# Patient Record
Sex: Male | Born: 1975 | Race: Black or African American | Hispanic: No | Marital: Married | Smoking: Never smoker
Health system: Southern US, Community
[De-identification: ages and names within clinical notes are randomized; demographics above are authoritative.]

## PROBLEM LIST (undated history)

## (undated) DIAGNOSIS — Z0000 Encounter for general adult medical examination without abnormal findings: Secondary | ICD-10-CM

## (undated) DIAGNOSIS — B20 Human immunodeficiency virus [HIV] disease: Secondary | ICD-10-CM

## (undated) DIAGNOSIS — Z21 Asymptomatic human immunodeficiency virus [HIV] infection status: Secondary | ICD-10-CM

## (undated) DIAGNOSIS — Z8619 Personal history of other infectious and parasitic diseases: Secondary | ICD-10-CM

## (undated) DIAGNOSIS — K4090 Unilateral inguinal hernia, without obstruction or gangrene, not specified as recurrent: Secondary | ICD-10-CM

## (undated) HISTORY — DX: Personal history of other infectious and parasitic diseases: Z86.19

## (undated) HISTORY — DX: Unilateral inguinal hernia, without obstruction or gangrene, not specified as recurrent: K40.90

## (undated) HISTORY — PX: NO PAST SURGERIES: SHX2092

## (undated) HISTORY — DX: Asymptomatic human immunodeficiency virus (hiv) infection status: Z21

## (undated) HISTORY — DX: Encounter for general adult medical examination without abnormal findings: Z00.00

## (undated) HISTORY — DX: Human immunodeficiency virus (HIV) disease: B20

---

## 2015-02-26 ENCOUNTER — Other Ambulatory Visit: Payer: Self-pay | Admitting: Infectious Disease

## 2015-02-26 ENCOUNTER — Ambulatory Visit
Admission: RE | Admit: 2015-02-26 | Discharge: 2015-02-26 | Disposition: A | Discharge status: Home / Self Care | Payer: No Typology Code available for payment source | Source: Ambulatory Visit | Attending: Infectious Disease | Admitting: Infectious Disease

## 2015-02-26 DIAGNOSIS — Z139 Encounter for screening, unspecified: Secondary | ICD-10-CM

## 2015-03-04 DIAGNOSIS — B20 Human immunodeficiency virus [HIV] disease: Secondary | ICD-10-CM | POA: Insufficient documentation

## 2015-03-29 DIAGNOSIS — A530 Latent syphilis, unspecified as early or late: Secondary | ICD-10-CM | POA: Insufficient documentation

## 2016-07-05 IMAGING — CR DG CHEST 1V
1 series · 1 of 1 positions shown · non-contrast
Comparison: None.

CLINICAL DATA: Tuberculosis screening.

EXAM:
CHEST 1 VIEW

[w chest pa]
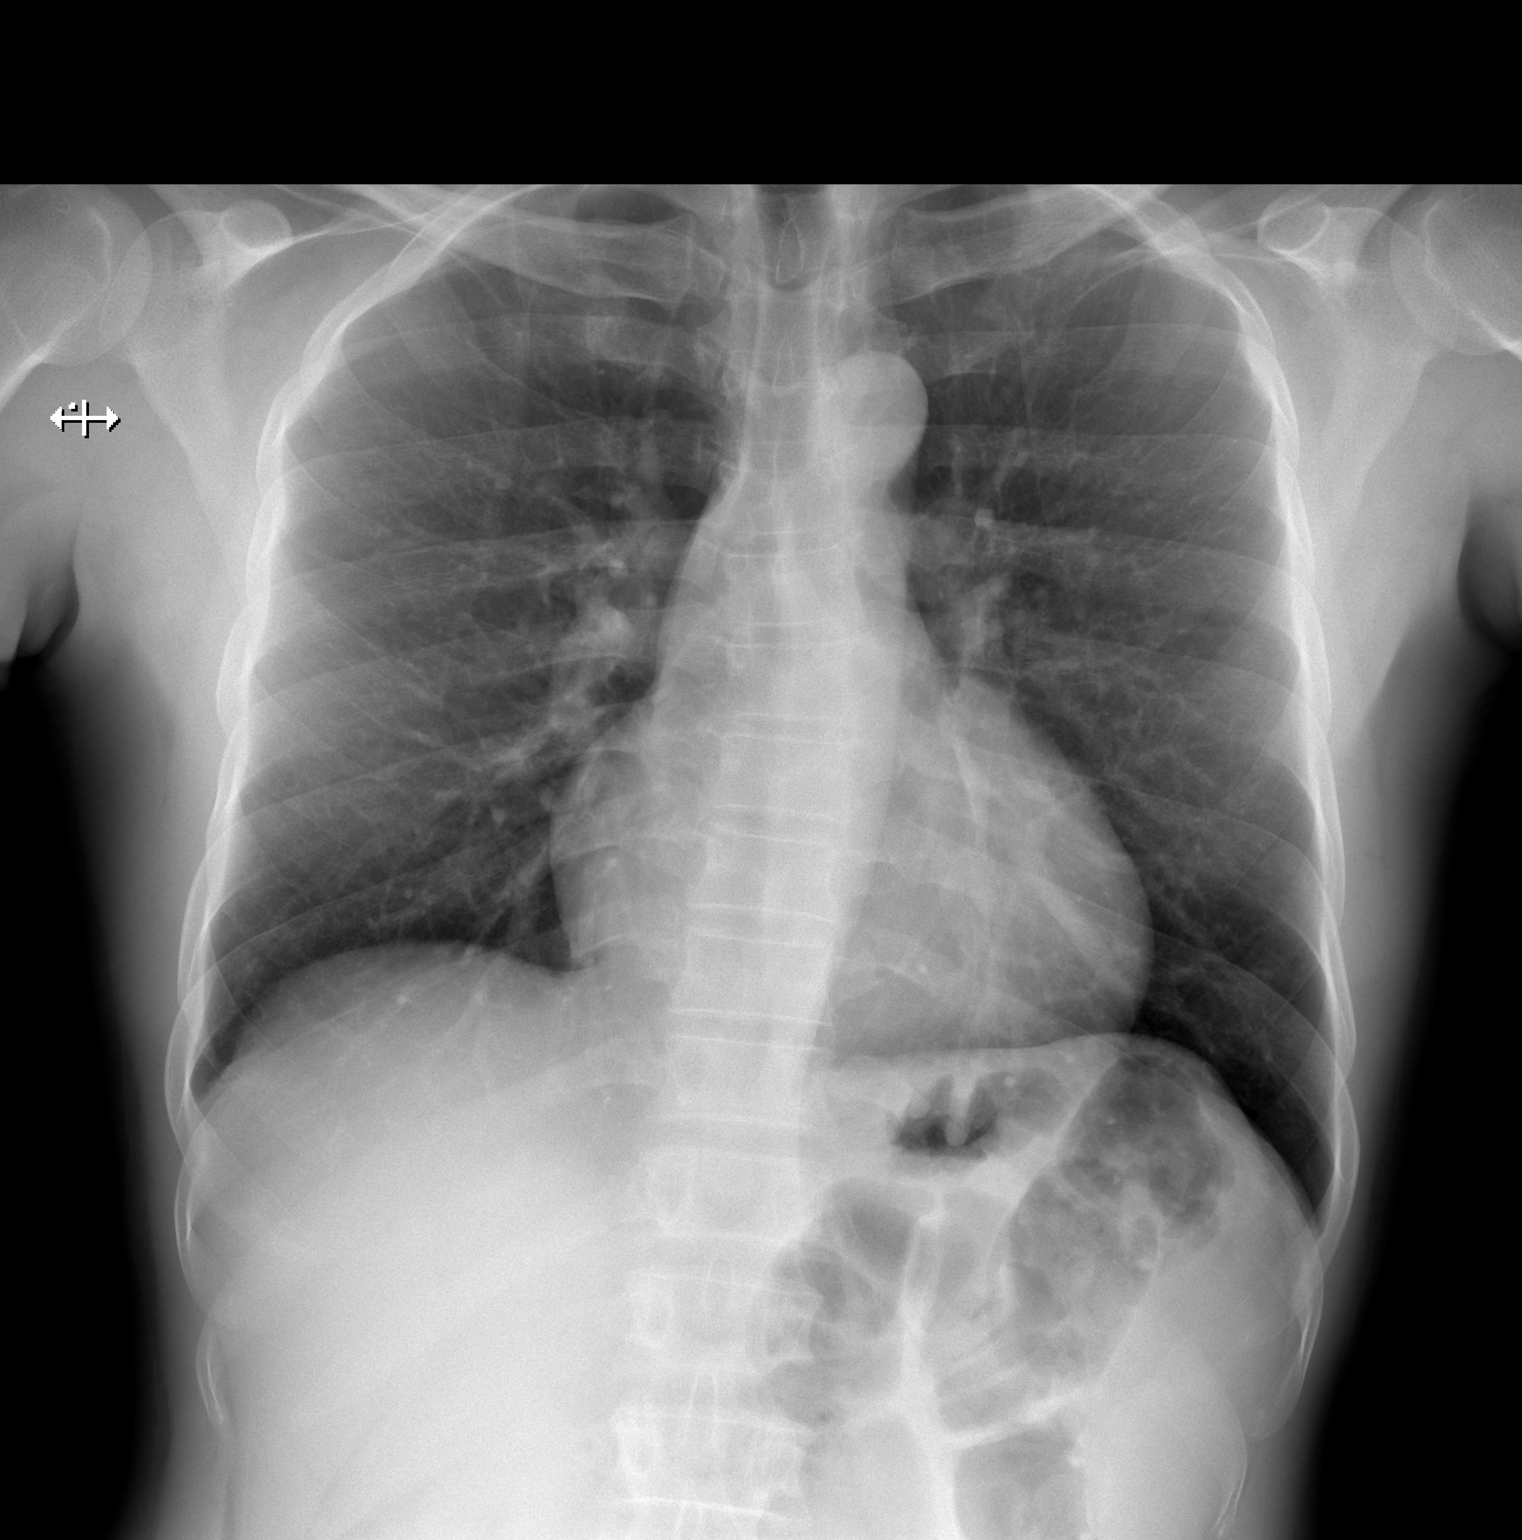

[1 of 1 positions shown; findings below may reference images not displayed]

FINDINGS: The heart size and mediastinal contours are within normal limits.
Both lungs are clear. No pneumothorax or pleural effusion is noted.
The visualized skeletal structures are unremarkable.
IMPRESSION: No acute cardiopulmonary abnormality seen.

## 2018-05-08 ENCOUNTER — Other Ambulatory Visit (HOSPITAL_COMMUNITY)
Admission: RE | Admit: 2018-05-08 | Discharge: 2018-05-08 | Disposition: A | Discharge status: Home / Self Care | Payer: Medicaid Other | Source: Ambulatory Visit | Attending: Infectious Diseases | Admitting: Infectious Diseases

## 2018-05-08 ENCOUNTER — Other Ambulatory Visit: Payer: Medicaid Other

## 2018-05-08 ENCOUNTER — Ambulatory Visit: Payer: Medicaid Other

## 2018-05-08 ENCOUNTER — Other Ambulatory Visit: Payer: Self-pay

## 2018-05-08 ENCOUNTER — Other Ambulatory Visit: Payer: Self-pay | Admitting: Behavioral Health

## 2018-05-08 DIAGNOSIS — Z79899 Other long term (current) drug therapy: Secondary | ICD-10-CM

## 2018-05-08 DIAGNOSIS — Z113 Encounter for screening for infections with a predominantly sexual mode of transmission: Secondary | ICD-10-CM | POA: Insufficient documentation

## 2018-05-08 DIAGNOSIS — B20 Human immunodeficiency virus [HIV] disease: Secondary | ICD-10-CM

## 2018-05-08 LAB — URINALYSIS
Bilirubin Urine: NEGATIVE
Glucose, UA: NEGATIVE
Hgb urine dipstick: NEGATIVE
Ketones, ur: NEGATIVE
Leukocytes,Ua: NEGATIVE
Nitrite: NEGATIVE
Protein, ur: NEGATIVE
Specific Gravity, Urine: 1.025 (ref 1.001–1.03)
pH: 5.5 (ref 5.0–8.0)

## 2018-05-09 LAB — T-HELPER CELL (CD4) - (RCID CLINIC ONLY)
CD4 % Helper T Cell: 32 % — ABNORMAL LOW (ref 33–55)
CD4 T Cell Abs: 350 /uL — ABNORMAL LOW (ref 400–2700)

## 2018-05-09 LAB — URINE CYTOLOGY ANCILLARY ONLY
Chlamydia: NEGATIVE
Neisseria Gonorrhea: NEGATIVE

## 2018-05-17 ENCOUNTER — Encounter: Payer: Self-pay | Admitting: Infectious Diseases

## 2018-05-17 LAB — HEPATITIS B SURFACE ANTIBODY,QUALITATIVE: Hep B S Ab: NONREACTIVE

## 2018-05-17 LAB — CBC WITH DIFFERENTIAL/PLATELET
Absolute Monocytes: 310 cells/uL (ref 200–950)
Basophils Absolute: 30 cells/uL (ref 0–200)
Basophils Relative: 0.9 %
Eosinophils Absolute: 340 cells/uL (ref 15–500)
Eosinophils Relative: 10.3 %
HCT: 44.9 % (ref 38.5–50.0)
Hemoglobin: 15.6 g/dL (ref 13.2–17.1)
Lymphs Abs: 1082 cells/uL (ref 850–3900)
MCH: 30.5 pg (ref 27.0–33.0)
MCHC: 34.7 g/dL (ref 32.0–36.0)
MCV: 87.9 fL (ref 80.0–100.0)
MPV: 10.6 fL (ref 7.5–12.5)
Monocytes Relative: 9.4 %
Neutro Abs: 1538 cells/uL (ref 1500–7800)
Neutrophils Relative %: 46.6 %
Platelets: 204 10*3/uL (ref 140–400)
RBC: 5.11 10*6/uL (ref 4.20–5.80)
RDW: 13 % (ref 11.0–15.0)
Total Lymphocyte: 32.8 %
WBC: 3.3 10*3/uL — ABNORMAL LOW (ref 3.8–10.8)

## 2018-05-17 LAB — QUANTIFERON-TB GOLD PLUS
Mitogen-NIL: 7.08 IU/mL
NIL: 0.04 IU/mL
QuantiFERON-TB Gold Plus: NEGATIVE
TB1-NIL: 0.01 IU/mL
TB2-NIL: 0 IU/mL

## 2018-05-17 LAB — HIV-1/2 AB - DIFFERENTIATION
HIV-1 antibody: POSITIVE — AB
HIV-2 Ab: NEGATIVE

## 2018-05-17 LAB — FLUORESCENT TREPONEMAL AB(FTA)-IGG-BLD: Fluorescent Treponemal ABS: REACTIVE — AB

## 2018-05-17 LAB — LIPID PANEL
Cholesterol: 173 mg/dL (ref <200)
HDL: 38 mg/dL — ABNORMAL LOW (ref >=40)
LDL Cholesterol (Calc): 104 mg/dL (calc) — ABNORMAL HIGH
Non-HDL Cholesterol (Calc): 135 mg/dL (calc) — ABNORMAL HIGH (ref <130)
Total CHOL/HDL Ratio: 4.6 (calc) (ref <5.0)
Triglycerides: 185 mg/dL — ABNORMAL HIGH (ref <150)

## 2018-05-17 LAB — COMPLETE METABOLIC PANEL WITH GFR
AG Ratio: 1.3 (calc) (ref 1.0–2.5)
ALT: 16 U/L (ref 9–46)
AST: 19 U/L (ref 10–40)
Albumin: 4.2 g/dL (ref 3.6–5.1)
Alkaline phosphatase (APISO): 45 U/L (ref 36–130)
BUN: 10 mg/dL (ref 7–25)
CO2: 26 mmol/L (ref 20–32)
Calcium: 8.8 mg/dL (ref 8.6–10.3)
Chloride: 105 mmol/L (ref 98–110)
Creat: 0.82 mg/dL (ref 0.60–1.35)
GFR, Est African American: 126 mL/min/{1.73_m2} (ref >=60)
GFR, Est Non African American: 109 mL/min/{1.73_m2} (ref >=60)
Globulin: 3.2 g/dL (calc) (ref 1.9–3.7)
Glucose, Bld: 110 mg/dL — ABNORMAL HIGH (ref 65–99)
Potassium: 4 mmol/L (ref 3.5–5.3)
Sodium: 138 mmol/L (ref 135–146)
Total Bilirubin: 0.3 mg/dL (ref 0.2–1.2)
Total Protein: 7.4 g/dL (ref 6.1–8.1)

## 2018-05-17 LAB — HEPATITIS B SURFACE ANTIGEN: Hepatitis B Surface Ag: NONREACTIVE

## 2018-05-17 LAB — HIV-1 RNA ULTRAQUANT REFLEX TO GENTYP+
HIV 1 RNA Quant: <20 copies/mL
HIV-1 RNA Quant, Log: <1.3 Log copies/mL

## 2018-05-17 LAB — HLA B*5701: HLA-B*5701 w/rflx HLA-B High: POSITIVE — AB

## 2018-05-17 LAB — RPR TITER: RPR Titer: 1:1 {titer} — ABNORMAL HIGH

## 2018-05-17 LAB — HEPATITIS C ANTIBODY
Hepatitis C Ab: NONREACTIVE
SIGNAL TO CUT-OFF: 0.06 (ref <1.00)

## 2018-05-17 LAB — HIV ANTIBODY (ROUTINE TESTING W REFLEX): HIV 1&2 Ab, 4th Generation: REACTIVE — AB

## 2018-05-17 LAB — HEPATITIS B CORE ANTIBODY, TOTAL: Hep B Core Total Ab: REACTIVE — AB

## 2018-05-17 LAB — RPR: RPR Ser Ql: REACTIVE — AB

## 2018-05-17 LAB — HEPATITIS A ANTIBODY, TOTAL: Hepatitis A AB,Total: REACTIVE — AB

## 2018-06-11 ENCOUNTER — Telehealth: Payer: Self-pay | Admitting: Pharmacy Technician

## 2018-06-11 ENCOUNTER — Ambulatory Visit: Payer: Self-pay | Admitting: Pharmacist

## 2018-06-11 ENCOUNTER — Encounter: Payer: Self-pay | Admitting: Infectious Diseases

## 2018-06-11 NOTE — Telephone Encounter (Signed)
[facility] Patient Product/process development scientist completed.    The patient is insured through [employer] commercial plan and has a $120 copay.  We will continue to follow to see if a copay card is needed.  Netty Starring. Dimas Aguas CPhT Specialty Pharmacy Patient [facility] Phone: [phone] Fax:  [phone]

## 2018-07-25 ENCOUNTER — Ambulatory Visit: Payer: Medicaid Other | Admitting: Infectious Diseases

## 2018-07-25 ENCOUNTER — Ambulatory Visit: Payer: Medicaid Other | Admitting: Pharmacist

## 2018-07-30 ENCOUNTER — Ambulatory Visit: Payer: Medicaid Other | Admitting: Infectious Diseases

## 2018-07-30 ENCOUNTER — Ambulatory Visit: Payer: Medicaid Other | Admitting: Pharmacist

## 2018-07-31 ENCOUNTER — Telehealth: Payer: Self-pay | Admitting: Infectious Diseases

## 2018-07-31 NOTE — Telephone Encounter (Signed)
COVID-19 Pre-Screening Questions: ° °Do you currently have a fever (>100 °F), chills or unexplained body aches? No  ° °Are you currently experiencing new cough, shortness of breath, sore throat, runny nose? No  °•  °Have you recently travelled outside the state of [place] in the last 14 days? No  °•  °1. Have you been in contact with someone that is currently pending confirmation of Covid19 testing or has been confirmed to have the Covid19 virus?  No  ° °

## 2018-08-01 ENCOUNTER — Encounter: Payer: Self-pay | Admitting: Infectious Diseases

## 2018-08-01 ENCOUNTER — Ambulatory Visit (INDEPENDENT_AMBULATORY_CARE_PROVIDER_SITE_OTHER): Payer: Medicaid Other | Admitting: Infectious Diseases

## 2018-08-01 ENCOUNTER — Other Ambulatory Visit: Payer: Self-pay

## 2018-08-01 ENCOUNTER — Ambulatory Visit: Payer: Medicaid Other | Admitting: Pharmacist

## 2018-08-01 VITALS — BP 158/106 | HR 70 | Temp 98.5°F

## 2018-08-01 DIAGNOSIS — Z21 Asymptomatic human immunodeficiency virus [HIV] infection status: Secondary | ICD-10-CM

## 2018-08-01 DIAGNOSIS — Z8619 Personal history of other infectious and parasitic diseases: Secondary | ICD-10-CM

## 2018-08-01 DIAGNOSIS — K4090 Unilateral inguinal hernia, without obstruction or gangrene, not specified as recurrent: Secondary | ICD-10-CM

## 2018-08-01 DIAGNOSIS — B20 Human immunodeficiency virus [HIV] disease: Secondary | ICD-10-CM

## 2018-08-01 DIAGNOSIS — Z0000 Encounter for general adult medical examination without abnormal findings: Secondary | ICD-10-CM

## 2018-08-01 NOTE — Patient Instructions (Addendum)
Please continue to take your Genfoya (blue-green pill) with food once a day   Please bring your pay stubs back as soon as possible so we can process your paperwork.   Will work on getting your referral to general surgery to look at your hernia.   Please come back in 1 month.

## 2018-08-01 NOTE — Progress Notes (Signed)
Name: Eric Keith  DOB: 29-Dec-1975 MRN: [id number] PCP: Patient, No Pcp Per    Brief Narrative:  Eric Keith is a 43 y.o. male with HIV disease, Dx at a rescue camp in [place] 2005. He did not start on medications right away but when he arrived here in [place] in 2016 he his since been on antiretroviral therapy.  CD4 nadir unknown VL unknown HIV Risk: geographic  History of OIs: Unknown Intake Labs 05/2018 Hep B sAg (-), sAb (-), cAb (-); Hep A (+), Hep C (-) Quantiferon (-) HLA B*5701 (+) G6PD: ()   Previous Regimens:  Stribild >> suppressed  Genotypes:  2017: wildtype  Subjective:  CC: New patient for HIV care. Left inguinal pain d/t hernia.  [place] interpreter was used on the telephone to facilitate visit.   HPI: He is here today for his new patient visit.  Unfortunately he is missed a few appointments since his lab work in April and we are unable to locate his records for care.  He says he was previously in care somewhere in [facility].  He is not certain where, who the provider was, or his medication names.  He is from [place] and has been in [place] since 2016. He was diagnosed with HIV while he was at a rescue camp in [place] in 2005.  He says he was not started on antiretroviral therapy right away and it was only when he arrived in [place] that was offered medication.  He says he is on 1 blue pill once a day but does not know the name.  He has access to medications and has a bottle at home now.  He does not miss any doses of his medications.  He does not think he has any concerns over side effects.  He did not bring enough documentation to start [place] application today.    He otherwise describes himself to be healthy and on no other medications or supplements daily.  He notices that he has trouble with tolerating the cold temperatures that he is exposed to at work at the [employer].  He does wear gloves which does  help.  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  Denies any alcohol, cigarette, drug use.   He has a left groin hernia that he is requesting surgery for.  He says that at work he spends a lot of time lifting things up and twisting at the waist.  He says that his hernia will pop out and cause him a fair amount of pain.  He is only able to reduce the hernia back into his abdomen after arrest.  Of sometimes up to 2 to 3 hours.  He denies any significant severe pain or change to bowel habits.  No fevers no chills.   Review of Systems  Constitutional: Negative for chills, fever, malaise/fatigue and weight loss.  HENT: Negative for sore throat.   Respiratory: Negative for cough, sputum production and shortness of breath.   Cardiovascular: Negative.   Gastrointestinal: Positive for abdominal pain (left hernia pain as described in HPI). Negative for diarrhea and vomiting.  Musculoskeletal: Negative for joint pain, myalgias and neck pain.  Skin: Negative for rash.       Hands intolerant to cold.   Neurological: Negative for headaches.  Psychiatric/Behavioral: Negative for depression and substance abuse. The patient is  not nervous/anxious.     Past Medical History:  Diagnosis Date   Healthcare maintenance    History of syphilis    HIV infection ([place])    Inguinal hernia of left side without obstruction or gangrene     Outpatient Medications Prior to Visit  Medication Sig Dispense Refill   elvitegravir-cobicistat-emtricitabine-tenofovir (STRIBILD) 150-150-200-300 MG TABS tablet Take 1 tablet by mouth daily with breakfast.     No facility-administered medications prior to visit.      Allergies  Allergen Reactions   Abacavir     HLA B*5701 (+)    Social History   Tobacco Use   Smoking status: Never Smoker  Substance Use Topics   Alcohol use: Never    Frequency: Never   Drug use: Never    No family  history on file.  Social History   Substance and Sexual Activity  Sexual Activity Yes   Partners: Female     Objective:   Vitals:   08/01/18 0939  BP: (!) 158/106  Pulse: 70  Temp: 98.5 F (36.9 C)  TempSrc: Oral   There is no height or weight on file to calculate BMI.  Physical Exam Constitutional:      Appearance: He is well-developed.     Comments: Seated comfortably in chair during visit.   HENT:     Mouth/Throat:     Dentition: Normal dentition. No dental abscesses.  Cardiovascular:     Rate and Rhythm: Normal rate and regular rhythm.     Heart sounds: Normal heart sounds.  Pulmonary:     Effort: Pulmonary effort is normal.     Breath sounds: Normal breath sounds.  Abdominal:     General: There is no distension.     Palpations: Abdomen is soft.     Tenderness: There is no abdominal tenderness.  Genitourinary:    Comments: Unable to perform exam with limited time availability with interpretor.  Lymphadenopathy:     Cervical: No cervical adenopathy.  Skin:    General: Skin is warm and dry.     Findings: No rash.  Neurological:     Mental Status: He is alert and oriented to person, place, and time.  Psychiatric:        Judgment: Judgment normal.     Comments: In good spirits today and engaged in care discussion.      Lab Results Lab Results  Component Value Date   WBC 3.3 (L) 05/08/2018   HGB 15.6 05/08/2018   HCT 44.9 05/08/2018   MCV 87.9 05/08/2018   PLT 204 05/08/2018    Lab Results  Component Value Date   CREATININE 0.82 05/08/2018   BUN 10 05/08/2018   NA 138 05/08/2018   K 4.0 05/08/2018   CL 105 05/08/2018   CO2 26 05/08/2018    Lab Results  Component Value Date   ALT 16 05/08/2018   AST 19 05/08/2018   BILITOT 0.3 05/08/2018    Lab Results  Component Value Date   CHOL 173 05/08/2018   HDL 38 (L) 05/08/2018   LDLCALC 104 (H) 05/08/2018   TRIG 185 (H) 05/08/2018   CHOLHDL 4.6 05/08/2018   HIV 1 RNA Quant (copies/mL)    Date Value  05/08/2018 <20 NOT DETECTED   CD4 T Cell Abs (/uL)  Date Value  05/08/2018 350 (L)     Assessment & Plan:   Problem List Items Addressed This Visit      Unprioritized   Healthcare maintenance  He is received the Menveo and Pneumovax in 2018.  He has hepatitis B core antibody positive this was also such the case back in 2018.  He does not have protective surface antibody and will recommend at future visits hepatitis B vaccine series. Flu shot in the fall.  Prevnar needed       History of syphilis    Syphilis titers previously indicat serofast between 1-2 to 1-4.  No signs of recurrent or reinfection.      HIV infection [facility])    New patient here to establish for HIV care. Currently maintained on Genvoya.  He had one other antiretroviral medication in the past prior to establishing with [facility] but this is not available per record report.  His viral load in April was undetectable with adequate CD4 count above 300.  He has no signs of opportunistic infection or advanced HIV on exam today.   I discussed with Michell Heinrich treatment options/side effects, benefits of treatment and long-term outcomes. I discussed how HIV is transmitted and the process of untreated HIV including increased risk for opportunistic infections, cancer, dementia and renal failure. Patient was counseled on routine HIV care including medication adherence, blood monitoring, necessary vaccines and follow up visits. Counseled regarding safe sex practices including: condom use, partner disclosure, limiting partners.  We did not have time to have him meet with Cassie today due to limited time from interpreter.  We will have him meet her at next appointment in 1 month.  Will continue his Genvoya for HIV treatment.  I stressed to him the importance of bringing required paperwork so we can avoid a delay relapse in his care and medications to treat his HIV.  He will bring the required pay stubs and drop  them off to our clinic for [facility]. Discussed interval for re-application today and services provided on [place] HMAP.   General introduction to our clinic and integrated services.  We were unable to complete the dental application today due to time constraints.  We will plan on discussing with him at his return visit in 1 month.  I spent greater than 30 minutes with the patient today. Greater than 50% of the time spent face-to-face counseling and coordination of care re: HIV and health maintenance.        Relevant Medications   elvitegravir-cobicistat-emtricitabine-tenofovir (STRIBILD) 150-150-200-300 MG TABS tablet   Inguinal hernia of left side without obstruction or gangrene    It appears that [facility] is try to get him into surgery for evaluation and management of his hernia for a while now.  Given his uninsured status will place referral for [facility].  I am not certain that this will work for him given his restrictions and transportation that I see documented in his chart.  Precautions discussed that would warrant urgent evaluation.  He does not have any urgent symptoms and this is been established but worsening problem for nearly 2 years now.  I tried showing in discussing a hernia belt although I do not think this translated well.  He is unable to avoid any heavy lifting to the nature of his work.  Unfortunately local general surgery team will not see uninsured patients.  Will send referral to [facility] and attempt to schedule an appointment.  This is a slowly worsening a chronic problem without any acute signs that needs intervention urgently.         Other Visit Diagnoses    Left groin hernia    -  Primary  Relevant Orders   Ambulatory referral to [place], MSN, NP-C [place] [facility] [place] Pager: [phone] Office: [phone]  08/02/18  9:31 AM

## 2018-08-01 NOTE — Progress Notes (Signed)
Patient had limited time with interpreter. I will meet with him the next time he is in clinic on 7/23.

## 2018-08-02 ENCOUNTER — Encounter: Payer: Self-pay | Admitting: Infectious Diseases

## 2018-08-02 DIAGNOSIS — B20 Human immunodeficiency virus [HIV] disease: Secondary | ICD-10-CM | POA: Insufficient documentation

## 2018-08-02 DIAGNOSIS — Z8619 Personal history of other infectious and parasitic diseases: Secondary | ICD-10-CM | POA: Insufficient documentation

## 2018-08-02 DIAGNOSIS — K4090 Unilateral inguinal hernia, without obstruction or gangrene, not specified as recurrent: Secondary | ICD-10-CM | POA: Insufficient documentation

## 2018-08-02 DIAGNOSIS — Z0000 Encounter for general adult medical examination without abnormal findings: Secondary | ICD-10-CM | POA: Insufficient documentation

## 2018-08-02 MED ORDER — GENVOYA 150-150-200-10 MG PO TABS: 1.0000 | ORAL_TABLET | Freq: Every day | ORAL | 5 refills | Status: DC

## 2018-08-02 NOTE — Assessment & Plan Note (Addendum)
It appears that [facility] is try to get him into surgery for evaluation and management of his hernia for a while now.  Given his uninsured status will place referral for [facility].  I am not certain that this will work for him given his restrictions and transportation that I see documented in his chart.  Precautions discussed that would warrant urgent evaluation.  He does not have any urgent symptoms and this is been established but worsening problem for nearly 2 years now.  I tried showing in discussing a hernia belt although I do not think this translated well.  He is unable to avoid any heavy lifting to the nature of his work.  Unfortunately local general surgery team will not see uninsured patients.  Will send referral to [facility] and attempt to schedule an appointment.  This is a slowly worsening a chronic problem without any acute signs that needs intervention urgently.

## 2018-08-02 NOTE — Assessment & Plan Note (Signed)
Syphilis titers previously indicat serofast between 1-2 to 1-4.  No signs of recurrent or reinfection.

## 2018-08-02 NOTE — Assessment & Plan Note (Signed)
New patient here to establish for HIV care. Currently maintained on Genvoya.  He had one other antiretroviral medication in the past prior to establishing with [facility] but this is not available per record report.  His viral load in April was undetectable with adequate CD4 count above 300.  He has no signs of opportunistic infection or advanced HIV on exam today.   I discussed with Michell Heinrich treatment options/side effects, benefits of treatment and long-term outcomes. I discussed how HIV is transmitted and the process of untreated HIV including increased risk for opportunistic infections, cancer, dementia and renal failure. Patient was counseled on routine HIV care including medication adherence, blood monitoring, necessary vaccines and follow up visits. Counseled regarding safe sex practices including: condom use, partner disclosure, limiting partners.  We did not have time to have him meet with Cassie today due to limited time from interpreter.  We will have him meet her at next appointment in 1 month.  Will continue his Genvoya for HIV treatment.  I stressed to him the importance of bringing required paperwork so we can avoid a delay relapse in his care and medications to treat his HIV.  He will bring the required pay stubs and drop them off to our clinic for [facility]. Discussed interval for re-application today and services provided on [place] HMAP.   General introduction to our clinic and integrated services.  We were unable to complete the dental application today due to time constraints.  We will plan on discussing with him at his return visit in 1 month.  I spent greater than 30 minutes with the patient today. Greater than 50% of the time spent face-to-face counseling and coordination of care re: HIV and health maintenance.

## 2018-08-02 NOTE — Assessment & Plan Note (Signed)
He is received the Menveo and Pneumovax in 2018.  He has hepatitis B core antibody positive this was also such the case back in 2018.  He does not have protective surface antibody and will recommend at future visits hepatitis B vaccine series. Flu shot in the fall.  Prevnar needed

## 2018-08-08 ENCOUNTER — Encounter: Payer: Self-pay | Admitting: Infectious Diseases

## 2018-08-29 ENCOUNTER — Other Ambulatory Visit: Payer: Self-pay

## 2018-08-29 ENCOUNTER — Ambulatory Visit (INDEPENDENT_AMBULATORY_CARE_PROVIDER_SITE_OTHER): Payer: Self-pay | Admitting: Infectious Diseases

## 2018-08-29 VITALS — BP 137/93 | HR 77 | Temp 98.1°F | Wt 207.0 lb

## 2018-08-29 DIAGNOSIS — B20 Human immunodeficiency virus [HIV] disease: Secondary | ICD-10-CM

## 2018-08-29 NOTE — Progress Notes (Signed)
Interpreter was not available for the visit.  We waited for about 30 minutes before we rescheduled the patient for Monday at 11:15.  We booked an appointment with [facility] interpreter at this time.  No charge today.

## 2018-08-29 NOTE — Telephone Encounter (Signed)
Patient commercial insurance is still active for appointment on 07/23.

## 2018-09-02 ENCOUNTER — Ambulatory Visit (INDEPENDENT_AMBULATORY_CARE_PROVIDER_SITE_OTHER): Payer: Self-pay | Admitting: Infectious Diseases

## 2018-09-02 ENCOUNTER — Other Ambulatory Visit: Payer: Self-pay

## 2018-09-02 ENCOUNTER — Encounter: Payer: Self-pay | Admitting: Infectious Diseases

## 2018-09-02 DIAGNOSIS — K4090 Unilateral inguinal hernia, without obstruction or gangrene, not specified as recurrent: Secondary | ICD-10-CM

## 2018-09-02 DIAGNOSIS — Z21 Asymptomatic human immunodeficiency virus [HIV] infection status: Secondary | ICD-10-CM

## 2018-09-02 NOTE — Patient Instructions (Addendum)
For your hernia -  --You have an appointment with Dr. Phineas Douglas on July 30th at 9:30 am --[facility]       [address]  [address]   [place] [address]   Phone#  [phone]  Please continue taking your Genvoya (green pill) every single day  Will have you come back in 3 months to repeat your blood work and get your.   Please bring pay stub from July 9th and July 16th any time this week Monday through Thursday so we can complete your application.

## 2018-09-02 NOTE — Assessment & Plan Note (Signed)
He has been doing well on his Genvoya. Met with Juliann Pulse again today - he needs to bring a few more pay stubs in to help with this application interval. He will bring them to the clnic this week once available.  He needs Hep B vaccines. Annual flu shot in the fall. Will defer until [employer] services verified.

## 2018-09-02 NOTE — Assessment & Plan Note (Signed)
Appointment rescheduled for July 30th @ 930 am and transportation arranged for evaluation. Communicated with him via Optometrist.

## 2018-09-02 NOTE — Progress Notes (Signed)
Name: Eric Keith  DOB: March 21, 1975 MRN: [id number] PCP: Patient, No Pcp Per    Brief Narrative:  Eric Keith is a 43 y.o. male with HIV disease, Dx at a rescue camp in [place] 2005. He did not start on medications right away but when he arrived here in [place] in 2016 he his since been on antiretroviral therapy.  CD4 nadir unknown VL unknown HIV Risk: geographic  History of OIs: Unknown Intake Labs 05/2018 Hep B sAg (-), sAb (-), cAb (-); Hep A (+), Hep C (-) Quantiferon (-) HLA B*5701 (+) G6PD: ()  Previous Regimens: Jorje Guild >> suppressed  Genotypes: . 2017: wildtype  Subjective:  CC: New HIV patient - only concern is referral to General Surgery.  Local [place] interpreter was used on the telephone to facilitate visit.   HPI: He is here today for follow up visit to ensure intake complete due to previous limited time with interpreter.   We were able to update his family and medical history today. He continues to take his Genvoya once daily as prescribed with food. No trouble with side effects and at the moment has full access to his medication.   His left groin hernia remains unchanged. Has not heard from Gen Surgery team yet. He continues to work evening shifts at [employer]. He has 2 pay stubs with him today for financial team.    Review of Systems  Constitutional: Negative for chills, fever, malaise/fatigue and weight loss.  HENT: Negative for sore throat.        No dental problems  Respiratory: Negative for cough and sputum production.   Cardiovascular: Negative for chest pain and leg swelling.  Gastrointestinal: Negative for abdominal pain, diarrhea and vomiting.  Genitourinary: Negative for dysuria and flank pain.  Musculoskeletal: Negative for joint pain, myalgias and neck pain.  Skin: Negative for rash.  Neurological: Negative for dizziness, tingling and headaches.  Psychiatric/Behavioral: Negative for depression and substance abuse. The  patient is not nervous/anxious and does not have insomnia.     Past Medical History:  Diagnosis Date  . Healthcare maintenance   . History of syphilis   . HIV infection ([place])   . Inguinal hernia of left side without obstruction or gangrene     Outpatient Medications Prior to Visit  Medication Sig Dispense Refill  . elvitegravir-cobicistat-emtricitabine-tenofovir (GENVOYA) 150-150-200-10 MG TABS tablet Take 1 tablet by mouth daily with breakfast. 30 tablet 5   No facility-administered medications prior to visit.      Allergies  Allergen Reactions  . Abacavir     HLA B*5701 (+)    Social History   Tobacco Use  . Smoking status: Never Smoker  Substance Use Topics  . Alcohol use: Never    Frequency: Never  . Drug use: Never    Family History  Problem Relation Age of Onset  . Healthy Mother   . Healthy Father     Social History   Substance and Sexual Activity  Sexual Activity Yes  . Partners: Female     Objective:   Vitals:   09/02/18 1137  BP: (!) 139/100  Pulse: 74  Temp: 98 F (36.7 C)  TempSrc: Oral  SpO2: 97%   There is no height or weight on file to calculate BMI.   Lab Results Lab Results  Component Value Date   WBC 3.3 (L) 05/08/2018   HGB 15.6 05/08/2018   HCT 44.9 05/08/2018   MCV 87.9 05/08/2018   PLT 204 05/08/2018  Lab Results  Component Value Date   CREATININE 0.82 05/08/2018   BUN 10 05/08/2018   NA 138 05/08/2018   K 4.0 05/08/2018   CL 105 05/08/2018   CO2 26 05/08/2018    Lab Results  Component Value Date   ALT 16 05/08/2018   AST 19 05/08/2018   BILITOT 0.3 05/08/2018    Lab Results  Component Value Date   CHOL 173 05/08/2018   HDL 38 (L) 05/08/2018   LDLCALC 104 (H) 05/08/2018   TRIG 185 (H) 05/08/2018   CHOLHDL 4.6 05/08/2018   HIV 1 RNA Quant (copies/mL)  Date Value  05/08/2018 <20 NOT DETECTED   CD4 T Cell Abs (/uL)  Date Value  05/08/2018 350 (L)     Assessment & Plan:   Problem List Items  Addressed This Visit      Unprioritized   Inguinal hernia of left side without obstruction or gangrene    Appointment rescheduled for July 30th @ 930 am and transportation arranged for evaluation. Communicated with him via Optometrist.       HIV infection ([place])    He has been doing well on his Genvoya. Met with Juliann Pulse again today - he needs to bring a few more pay stubs in to help with this application interval. He will bring them to the clnic this week once available.  He needs Hep B vaccines. Annual flu shot in the fall. Will defer until [employer] services verified.         Return in about 3 months (around 12/03/2018) for follow up.   25 min spent during the visit in discussion of the above and in coordination of his care and referrals.   Janene Madeira, MSN, NP-C [facility] [place] Pager: [phone] Office: [phone]  09/02/18  4:12 PM

## 2018-10-22 DIAGNOSIS — Z9889 Other specified postprocedural states: Secondary | ICD-10-CM | POA: Insufficient documentation

## 2018-11-08 ENCOUNTER — Other Ambulatory Visit: Payer: Self-pay | Admitting: *Deleted

## 2018-11-08 DIAGNOSIS — B20 Human immunodeficiency virus [HIV] disease: Secondary | ICD-10-CM

## 2018-11-11 ENCOUNTER — Other Ambulatory Visit: Payer: Medicaid Other

## 2018-11-25 ENCOUNTER — Encounter: Payer: Medicaid Other | Admitting: Infectious Diseases

## 2018-12-11 ENCOUNTER — Other Ambulatory Visit: Payer: Medicaid Other

## 2018-12-11 ENCOUNTER — Other Ambulatory Visit: Payer: Self-pay

## 2018-12-11 DIAGNOSIS — B20 Human immunodeficiency virus [HIV] disease: Secondary | ICD-10-CM

## 2018-12-12 LAB — T-HELPER CELL (CD4) - (RCID CLINIC ONLY)
CD4 % Helper T Cell: 27 % — ABNORMAL LOW (ref 33–65)
CD4 T Cell Abs: 599 /uL (ref 400–1790)

## 2018-12-15 LAB — CBC WITH DIFFERENTIAL/PLATELET
Absolute Monocytes: 431 cells/uL (ref 200–950)
Basophils Absolute: 40 cells/uL (ref 0–200)
Basophils Relative: 0.9 %
Eosinophils Absolute: 480 cells/uL (ref 15–500)
Eosinophils Relative: 10.9 %
HCT: 43.4 % (ref 38.5–50.0)
Hemoglobin: 14.8 g/dL (ref 13.2–17.1)
Lymphs Abs: 2169 cells/uL (ref 850–3900)
MCH: 30.5 pg (ref 27.0–33.0)
MCHC: 34.1 g/dL (ref 32.0–36.0)
MCV: 89.3 fL (ref 80.0–100.0)
MPV: 9.7 fL (ref 7.5–12.5)
Monocytes Relative: 9.8 %
Neutro Abs: 1280 cells/uL — ABNORMAL LOW (ref 1500–7800)
Neutrophils Relative %: 29.1 %
Platelets: 216 10*3/uL (ref 140–400)
RBC: 4.86 10*6/uL (ref 4.20–5.80)
RDW: 13.4 % (ref 11.0–15.0)
Total Lymphocyte: 49.3 %
WBC: 4.4 10*3/uL (ref 3.8–10.8)

## 2018-12-15 LAB — COMPLETE METABOLIC PANEL WITH GFR
AG Ratio: 1.2 (calc) (ref 1.0–2.5)
ALT: 21 U/L (ref 9–46)
AST: 24 U/L (ref 10–40)
Albumin: 4.3 g/dL (ref 3.6–5.1)
Alkaline phosphatase (APISO): 59 U/L (ref 36–130)
BUN: 14 mg/dL (ref 7–25)
CO2: 30 mmol/L (ref 20–32)
Calcium: 9.3 mg/dL (ref 8.6–10.3)
Chloride: 103 mmol/L (ref 98–110)
Creat: 0.94 mg/dL (ref 0.60–1.35)
GFR, Est African American: 115 mL/min/{1.73_m2} (ref >=60)
GFR, Est Non African American: 100 mL/min/{1.73_m2} (ref >=60)
Globulin: 3.5 g/dL (calc) (ref 1.9–3.7)
Glucose, Bld: 91 mg/dL (ref 65–99)
Potassium: 4 mmol/L (ref 3.5–5.3)
Sodium: 141 mmol/L (ref 135–146)
Total Bilirubin: 0.4 mg/dL (ref 0.2–1.2)
Total Protein: 7.8 g/dL (ref 6.1–8.1)

## 2018-12-15 LAB — HIV-1 RNA QUANT-NO REFLEX-BLD
HIV 1 RNA Quant: <20 copies/mL
HIV-1 RNA Quant, Log: <1.3 Log copies/mL

## 2018-12-26 ENCOUNTER — Ambulatory Visit (INDEPENDENT_AMBULATORY_CARE_PROVIDER_SITE_OTHER): Payer: Self-pay | Admitting: Infectious Diseases

## 2018-12-26 ENCOUNTER — Encounter: Payer: Self-pay | Admitting: Infectious Diseases

## 2018-12-26 ENCOUNTER — Other Ambulatory Visit: Payer: Self-pay

## 2018-12-26 DIAGNOSIS — Z21 Asymptomatic human immunodeficiency virus [HIV] infection status: Secondary | ICD-10-CM

## 2018-12-26 DIAGNOSIS — K4090 Unilateral inguinal hernia, without obstruction or gangrene, not specified as recurrent: Secondary | ICD-10-CM

## 2018-12-26 DIAGNOSIS — Z23 Encounter for immunization: Secondary | ICD-10-CM

## 2018-12-26 DIAGNOSIS — Z0000 Encounter for general adult medical examination without abnormal findings: Secondary | ICD-10-CM

## 2018-12-26 MED ORDER — GENVOYA 150-150-200-10 MG PO TABS: 1.0000 | ORAL_TABLET | Freq: Every day | ORAL | 5 refills | Status: DC

## 2018-12-26 NOTE — Assessment & Plan Note (Signed)
He has had surgical repair and doing well.

## 2018-12-26 NOTE — Assessment & Plan Note (Signed)
Flu vaccine today. Otherwise up to date for recommended vaccines for [facility].

## 2018-12-26 NOTE — Patient Instructions (Signed)
Please continue your Genvoya every day as you are.   Please come back in 6 months with labs prior to your appointment.   Will try to coordinate dental visit for you.

## 2018-12-26 NOTE — Assessment & Plan Note (Signed)
Well controlled on current regimen on Genvoya. Will continue this for him and have him back for routine care in 6 months.  Will give flu shot today and try to coordinate

## 2018-12-26 NOTE — Progress Notes (Signed)
Name: Eric Keith  DOB: 1975/06/19 MRN: [id number] PCP: Patient, No Pcp Per    Brief Narrative:  Eric Keith is a 43 y.o. male with HIV disease, Dx at a rescue camp in [place] 2005. He did not start on medications right away but when he arrived here in [place] in 2016 he his since been on antiretroviral therapy.  CD4 nadir unknown VL unknown HIV Risk: geographic  History of OIs: Unknown Intake Labs 05/2018 Hep B sAg (-), sAb (-), cAb (-); Hep A (+), Hep C (-) Quantiferon (-) HLA B*5701 (+) G6PD: ()  Previous Regimens: Jorje Guild >> suppressed  Genotypes: . 2017: wildtype  Subjective:  CC: New HIV patient - only concern is referral to General Surgery.  Local [place] interpreter was used on the telephone to facilitate visit.   HPI: He is here today for follow up care for HIV disease.  He continues to take his Genvoya once daily as prescribed with food. No trouble with side effects and at the moment has full access to his medication. He has a young son at home that is not quite a year old and doing well. Growing nicely. His wife is HIV+ and also a patient here, well controlled.   Pain is much better since surgery repair of his hernia. He has continued to follow with his surgeon for recovery and is doing well.   Has not had flu shot yet.    Review of Systems  Constitutional: Negative for chills, fever, malaise/fatigue and weight loss.  HENT: Negative for sore throat.        No dental problems  Respiratory: Negative for cough and sputum production.   Cardiovascular: Negative for chest pain and leg swelling.  Gastrointestinal: Negative for abdominal pain, diarrhea and vomiting.  Genitourinary: Negative for dysuria and flank pain.  Musculoskeletal: Negative for joint pain, myalgias and neck pain.  Skin: Negative for rash.  Neurological: Negative for dizziness, tingling and headaches.  Psychiatric/Behavioral: Negative for depression and substance abuse. The patient  is not nervous/anxious and does not have insomnia.     Past Medical History:  Diagnosis Date  . Healthcare maintenance   . History of syphilis   . HIV infection ([place])   . Inguinal hernia of left side without obstruction or gangrene     Outpatient Medications Prior to Visit  Medication Sig Dispense Refill  . elvitegravir-cobicistat-emtricitabine-tenofovir (GENVOYA) 150-150-200-10 MG TABS tablet Take 1 tablet by mouth daily with breakfast. 30 tablet 5   No facility-administered medications prior to visit.      Allergies  Allergen Reactions  . Abacavir     HLA B*5701 (+)    Social History   Tobacco Use  . Smoking status: Never Smoker  . Smokeless tobacco: Never Used  Substance Use Topics  . Alcohol use: Never    Frequency: Never  . Drug use: Never    Social History   Substance and Sexual Activity  Sexual Activity Yes  . Partners: Female     Objective:   Vitals:   12/26/18 1018  BP: (!) 143/104  Pulse: 74  Temp: 97.7 F (36.5 C)  TempSrc: Oral  Weight: 214 lb (97.1 kg)   There is no height or weight on file to calculate BMI.   Lab Results Lab Results  Component Value Date   WBC 4.4 12/11/2018   HGB 14.8 12/11/2018   HCT 43.4 12/11/2018   MCV 89.3 12/11/2018   PLT 216 12/11/2018    Lab Results  Component  Value Date   CREATININE 0.94 12/11/2018   BUN 14 12/11/2018   NA 141 12/11/2018   K 4.0 12/11/2018   CL 103 12/11/2018   CO2 30 12/11/2018    Lab Results  Component Value Date   ALT 21 12/11/2018   AST 24 12/11/2018   BILITOT 0.4 12/11/2018    Lab Results  Component Value Date   CHOL 173 05/08/2018   HDL 38 (L) 05/08/2018   LDLCALC 104 (H) 05/08/2018   TRIG 185 (H) 05/08/2018   CHOLHDL 4.6 05/08/2018   HIV 1 RNA Quant (copies/mL)  Date Value  12/11/2018 <20 NOT DETECTED  05/08/2018 <20 NOT DETECTED   CD4 T Cell Abs (/uL)  Date Value  12/11/2018 599  05/08/2018 350 (L)     Assessment & Plan:   Problem List Items  Addressed This Visit      Unprioritized   RESOLVED: Inguinal hernia of left side without obstruction or gangrene    He has had surgical repair and doing well.       HIV infection ([place]) (Chronic)    Well controlled on current regimen on Genvoya. Will continue this for him and have him back for routine care in 6 months.  Will give flu shot today and try to coordinate       Relevant Medications   elvitegravir-cobicistat-emtricitabine-tenofovir (GENVOYA) 150-150-200-10 MG TABS tablet   Other Relevant Orders   HIV 1 RNA quant-no reflex-bld   T-helper cell (CD4)- ([facility] clinic only)   COMPLETE METABOLIC PANEL WITH GFR   CBC with Differential   Healthcare maintenance    Flu vaccine today. Otherwise up to date for recommended vaccines for [facility].          Janene Madeira, MSN, NP-C [facility] for Infectious [place] Pager: [phone] Office: [phone]  12/26/18  10:59 AM

## 2019-06-05 ENCOUNTER — Other Ambulatory Visit: Payer: Medicaid Other

## 2019-06-19 ENCOUNTER — Encounter: Payer: Medicaid Other | Admitting: Infectious Diseases

## 2019-07-10 ENCOUNTER — Ambulatory Visit (INDEPENDENT_AMBULATORY_CARE_PROVIDER_SITE_OTHER): Payer: Self-pay | Admitting: Infectious Diseases

## 2019-07-10 ENCOUNTER — Other Ambulatory Visit: Payer: Self-pay

## 2019-07-10 ENCOUNTER — Ambulatory Visit: Payer: Self-pay

## 2019-07-10 ENCOUNTER — Encounter: Payer: Self-pay | Admitting: Infectious Diseases

## 2019-07-10 VITALS — BP 155/115 | HR 71 | Wt 207.0 lb

## 2019-07-10 DIAGNOSIS — I10 Essential (primary) hypertension: Secondary | ICD-10-CM

## 2019-07-10 DIAGNOSIS — Z0000 Encounter for general adult medical examination without abnormal findings: Secondary | ICD-10-CM

## 2019-07-10 DIAGNOSIS — Z21 Asymptomatic human immunodeficiency virus [HIV] infection status: Secondary | ICD-10-CM

## 2019-07-10 MED ORDER — AMLODIPINE BESYLATE 5 MG PO TABS: 5.0000 mg | ORAL_TABLET | Freq: Every day | ORAL | 11 refills | Status: DC

## 2019-07-10 MED ORDER — BIKTARVY 50-200-25 MG PO TABS: 1.0000 | ORAL_TABLET | Freq: Every day | ORAL | 11 refills | Status: DC

## 2019-07-10 NOTE — Assessment & Plan Note (Signed)
Recommended COVID vaccine - it sounds like he may have an opportunity to receive at work but the schedule may not work out. His interpretor helped [place] figure out other options he would be eligible for that he would have an easier time accessing.

## 2019-07-10 NOTE — Assessment & Plan Note (Addendum)
We have discussed this multiple times but he is unable to secure PCP. Will go ahead and start Amlodipine 5 mg daily and repeat BP at return visit.  Counseled re: lifestyle changes that would help lower - he does not smoke or drink. I feel like stress, chronic lack of adequate sleep and possibly genetic risks are contributing.  BMP today.

## 2019-07-10 NOTE — Patient Instructions (Addendum)
Please come back in 3 months.   Please stop by the lab today. Please also see our financial team to re-apply for your ADAP insurance.   STOP your Genvoya   START Biktarvy once a day (with or without food) Biktarvy is the pill I would like for you to start taking to treat you - this will need to be taken once a day around the same time.  - Common side effects for a short time frame usually include headaches, nausea and diarrhea - OK to take over the counter tylenol for headaches and imodium for diarrhea - Try taking with food if you are nauseated  -If you take any multivitamins or supplements please separate them from your Biktarvy by 6 hours before and after.  The main thing is do not have them in the stomach at the same time.   START Amlodipine tablet once a day - this is for your blood pressure. It has been high for a few months now and we need to lower this for you.

## 2019-07-10 NOTE — Progress Notes (Signed)
Name: Eric Keith  DOB: 03-17-1975 MRN: [id number] PCP: Patient, No Pcp Per    Brief Narrative:  Eric Keith is a 44 y.o. male with HIV disease, Dx at a rescue camp in [place] 2005. He did not start on medications right away but when he arrived here in [place] in 2016 he his since been on antiretroviral therapy.  CD4 nadir unknown VL unknown HIV Risk: geographic  History of OIs: Unknown Intake Labs 05/2018 Hep B sAg (-), sAb (-), cAb (-); Hep A (+), Hep C (-) Quantiferon (-) HLA B*5701 (+) G6PD: ()  Previous Regimens:  Genvoya >> suppressed  Biktarvy   Genotypes:  2017: wildtype  Subjective:  CC: HIV follow up care - out of medications x 5 months.    HPI: Interpretor present for the visit today.   He is here today for follow up care for HIV disease.  He ran out of Genvoya 5 months ago - states it just stopped coming in the mail and he did not know who to call. He is very worried about being off of his medication. He was taking this daily up until he ran out and stopped abruptly. Currently sexually active with wife who is also HIV+  No changes in his health since last office visit. He has a little pain he notices with long periods of standing at the site of hernia repair but nothing significant.   He has lost about 7 lbs since November. Sleeping is interrupted given his work schedule and children's schedule (he works overnight until [employer] with [employer]). Transportation is very challenging for him.   He does not have a PCP. Never on blood pressure mediations. He does not drink alcohol or smoke cigarettes. He does not know family history related heart disease.    Review of Systems  Constitutional: Negative for chills, fever, malaise/fatigue and weight loss.  HENT: Negative for sore throat.        No dental problems  Respiratory: Negative for cough and sputum production.   Cardiovascular: Negative for chest pain and leg swelling.  Gastrointestinal:  Negative for abdominal pain, diarrhea and vomiting.  Genitourinary: Negative for dysuria and flank pain.  Musculoskeletal: Negative for joint pain, myalgias and neck pain.  Skin: Negative for rash.  Neurological: Negative for dizziness, tingling and headaches.  Psychiatric/Behavioral: Negative for depression and substance abuse. The patient is not nervous/anxious and does not have insomnia.     Past Medical History:  Diagnosis Date   Healthcare maintenance    History of syphilis    HIV infection ([place])    Inguinal hernia of left side without obstruction or gangrene     Outpatient Medications Prior to Visit  Medication Sig Dispense Refill   elvitegravir-cobicistat-emtricitabine-tenofovir (GENVOYA) 150-150-200-10 MG TABS tablet Take 1 tablet by mouth daily with breakfast. (Patient not taking: Reported on 07/10/2019) 30 tablet 5   No facility-administered medications prior to visit.     Allergies  Allergen Reactions   Abacavir     HLA B*5701 (+)    Social History   Tobacco Use   Smoking status: Never Smoker   Smokeless tobacco: Never Used  Substance Use Topics   Alcohol use: Never   Drug use: Never    Social History   Substance and Sexual Activity  Sexual Activity Yes   Partners: Female     Objective:   Vitals:   07/10/19 1000  BP: (!) 155/115  Pulse: 71  Weight: 207 lb (93.9 kg)   There  is no height or weight on file to calculate BMI.  Physical Exam Constitutional:      Appearance: He is well-developed.     Comments: Seated comfortably in chair during visit.   HENT:     Mouth/Throat:     Dentition: Normal dentition. No dental abscesses.  Cardiovascular:     Rate and Rhythm: Normal rate and regular rhythm.     Heart sounds: Normal heart sounds.  Pulmonary:     Effort: Pulmonary effort is normal.     Breath sounds: Normal breath sounds.  Abdominal:     General: There is no distension.     Palpations: Abdomen is soft.     Tenderness: There  is no abdominal tenderness.  Lymphadenopathy:     Cervical: No cervical adenopathy.  Skin:    General: Skin is warm and dry.     Findings: No rash.  Neurological:     Mental Status: He is alert and oriented to person, place, and time.  Psychiatric:        Judgment: Judgment normal.     Comments: In good spirits today and engaged in care discussion.       Lab Results Lab Results  Component Value Date   WBC 4.4 12/11/2018   HGB 14.8 12/11/2018   HCT 43.4 12/11/2018   MCV 89.3 12/11/2018   PLT 216 12/11/2018    Lab Results  Component Value Date   CREATININE 0.94 12/11/2018   BUN 14 12/11/2018   NA 141 12/11/2018   K 4.0 12/11/2018   CL 103 12/11/2018   CO2 30 12/11/2018    Lab Results  Component Value Date   ALT 21 12/11/2018   AST 24 12/11/2018   BILITOT 0.4 12/11/2018    Lab Results  Component Value Date   CHOL 173 05/08/2018   HDL 38 (L) 05/08/2018   LDLCALC 104 (H) 05/08/2018   TRIG 185 (H) 05/08/2018   CHOLHDL 4.6 05/08/2018   HIV 1 RNA Quant (copies/mL)  Date Value  12/11/2018 <20 NOT DETECTED  05/08/2018 <20 NOT DETECTED   CD4 T Cell Abs (/uL)  Date Value  12/11/2018 599  05/08/2018 350 (L)     Assessment & Plan:   Problem List Items Addressed This Visit      Unprioritized   HIV infection ([place]) - Primary (Chronic)    He missed ADAP enrollment and received last medication in March of this year. We discussed switching to Biktarvy in the past - will give him a month's worth of samples today and officially switch him now. Counseled on proper use of medication, avoidance of multivitamin coadministration, TUMS/Rolaids (he does not take) and side effects with induction period.  He will meet with financial team today to re-apply - we discussed re-enrollment period today and made sure he had the information regarding clinic number so if there is a problem in the future we can help him more expeditiously. His CD4 7 months ago was > 500 and I would expect  this to be unchanged.  Will draw pertinent labs today and have him return in 3 months - would like to do sooner since we are switching medications but transportation is not easy for him.         Relevant Medications   bictegravir-emtricitabine-tenofovir AF (BIKTARVY) 50-200-25 MG TABS tablet   Other Relevant Orders   HIV-1 RNA quant-no reflex-bld   T-helper cell (CD4)- ([facility] clinic only)   COMPLETE METABOLIC PANEL WITH GFR   Healthcare maintenance  Recommended COVID vaccine - it sounds like he may have an opportunity to receive at work but the schedule may not work out. His interpretor helped [place] figure out other options he would be eligible for that he would have an easier time accessing.       Hypertension    We have discussed this multiple times but he is unable to secure PCP. Will go ahead and start Amlodipine 5 mg daily and repeat BP at return visit.  Counseled re: lifestyle changes that would help lower - he does not smoke or drink. I feel like stress, chronic lack of adequate sleep and possibly genetic risks are contributing.  BMP today.      Relevant Medications   amLODipine (NORVASC) 5 MG tablet      Janene Madeira, MSN, NP-C [facility] for Infectious [place] Pager: [phone] Office: [phone]  07/10/19  11:41 AM

## 2019-07-10 NOTE — Assessment & Plan Note (Signed)
He missed ADAP enrollment and received last medication in March of this year. We discussed switching to Biktarvy in the past - will give him a month's worth of samples today and officially switch him now. Counseled on proper use of medication, avoidance of multivitamin coadministration, TUMS/Rolaids (he does not take) and side effects with induction period.  He will meet with financial team today to re-apply - we discussed re-enrollment period today and made sure he had the information regarding clinic number so if there is a problem in the future we can help him more expeditiously. His CD4 7 months ago was > 500 and I would expect this to be unchanged.  Will draw pertinent labs today and have him return in 3 months - would like to do sooner since we are switching medications but transportation is not easy for him.

## 2019-07-10 NOTE — Progress Notes (Signed)
Patient meeting with Eric Keith today to renew ADAP. Was given 1 month supply of Biktarvy to bridge him until application is approved. Instructed to call office to confirm approval when he's nearing the end of medication supply. After confirmation, given the number to pharmacy [facility]) and instructed to call them to set up delivery of medications due to transportation difficulties. Reviewed this with a translator, and patient verbalized understanding.   Jaiana Sheffer Loyola Mast, RN

## 2019-07-11 LAB — T-HELPER CELL (CD4) - (RCID CLINIC ONLY)
CD4 % Helper T Cell: 29 % — ABNORMAL LOW (ref 33–65)
CD4 T Cell Abs: 466 /uL (ref 400–1790)

## 2019-07-12 LAB — COMPLETE METABOLIC PANEL WITH GFR
AG Ratio: 1.3 (calc) (ref 1.0–2.5)
ALT: 19 U/L (ref 9–46)
AST: 23 U/L (ref 10–40)
Albumin: 4.3 g/dL (ref 3.6–5.1)
Alkaline phosphatase (APISO): 50 U/L (ref 36–130)
BUN: 14 mg/dL (ref 7–25)
CO2: 28 mmol/L (ref 20–32)
Calcium: 9.3 mg/dL (ref 8.6–10.3)
Chloride: 103 mmol/L (ref 98–110)
Creat: 0.83 mg/dL (ref 0.60–1.35)
GFR, Est African American: 125 mL/min/{1.73_m2} (ref >=60)
GFR, Est Non African American: 108 mL/min/{1.73_m2} (ref >=60)
Globulin: 3.3 g/dL (calc) (ref 1.9–3.7)
Glucose, Bld: 97 mg/dL (ref 65–99)
Potassium: 4.2 mmol/L (ref 3.5–5.3)
Sodium: 139 mmol/L (ref 135–146)
Total Bilirubin: 0.4 mg/dL (ref 0.2–1.2)
Total Protein: 7.6 g/dL (ref 6.1–8.1)

## 2019-07-12 LAB — HIV-1 RNA QUANT-NO REFLEX-BLD
HIV 1 RNA Quant: 80 copies/mL — ABNORMAL HIGH
HIV-1 RNA Quant, Log: 1.9 Log copies/mL — ABNORMAL HIGH

## 2019-07-21 ENCOUNTER — Encounter: Payer: Self-pay | Admitting: Infectious Diseases

## 2019-10-10 ENCOUNTER — Ambulatory Visit: Payer: Medicaid Other | Admitting: Infectious Diseases

## 2019-10-30 ENCOUNTER — Other Ambulatory Visit: Payer: Self-pay

## 2019-10-30 ENCOUNTER — Encounter: Payer: Self-pay | Admitting: Infectious Diseases

## 2019-10-30 ENCOUNTER — Ambulatory Visit (INDEPENDENT_AMBULATORY_CARE_PROVIDER_SITE_OTHER): Payer: Self-pay | Admitting: Infectious Diseases

## 2019-10-30 VITALS — BP 163/106 | HR 60 | Temp 97.9°F | Wt 205.0 lb

## 2019-10-30 DIAGNOSIS — I10 Essential (primary) hypertension: Secondary | ICD-10-CM

## 2019-10-30 DIAGNOSIS — Z0000 Encounter for general adult medical examination without abnormal findings: Secondary | ICD-10-CM

## 2019-10-30 DIAGNOSIS — Z21 Asymptomatic human immunodeficiency virus [HIV] infection status: Secondary | ICD-10-CM

## 2019-10-30 DIAGNOSIS — Z23 Encounter for immunization: Secondary | ICD-10-CM

## 2019-10-30 MED ORDER — AMLODIPINE BESYLATE 5 MG PO TABS: 5.0000 mg | ORAL_TABLET | Freq: Every day | ORAL | 11 refills | Status: DC

## 2019-10-30 NOTE — Patient Instructions (Addendum)
Please CONTINUE the Biktarvy everyday.   For your blood pressure -  Please START the Amlodipine ONE tablet ONCE a day. Take with your Biktarvy so you don't forget it.   Please come back in 3 months - we can do blood work at the visit.   We gave you your flu shot today

## 2019-10-30 NOTE — Assessment & Plan Note (Signed)
Flu shot to be given today.

## 2019-10-30 NOTE — Assessment & Plan Note (Signed)
Doing well on Biktarvy with undetectable viral loads, however given challenges with accessing medication, he has had a few lapses. We spent time with our pharmacy patient advocate to arrange shipping medications monthly.  Will defer labs today  Flu shot updated.  COVID Vaccine recorded in chart.  Routine STI screening not necessary.  RTC in 33m to follow up on BP and check labs at that time.

## 2019-10-30 NOTE — Progress Notes (Signed)
Name: Eric Keith  DOB: 10/19/1975 MRN: [id number] PCP: Patient, No Pcp Per    Brief Narrative:  Eric Keith is a 44 y.o. male with HIV disease, Dx at a rescue camp in [place] 2005. He did not start on medications right away but when he arrived here in [place] in 2016 he his since been on antiretroviral therapy.  CD4 nadir unknown VL unknown HIV Risk: geographic  History of OIs: Unknown Intake Labs 05/2018 Hep B sAg (-), sAb (-), cAb (-); Hep A (+), Hep C (-) Quantiferon (-) HLA B*5701 (+) G6PD: ()  Previous Regimens: Jorje Guild >> suppressed . Biktarvy    Genotypes: . 2017: wildtype    Subjective:  CC: HIV follow up care Difficulty getting medications from pharmacy    HPI: Interpretor present for the visit today.  He has had a hard time obtaining his Biktarvy from his pharmacy at consistent intervals to avoid lapses in medication. His younger daughter who knows English, usually helps him order this but since she has been back at school this has been more difficult for him. This causes him a fair amount of stress.   He received his Pfizer vaccine series with completion 2 weeks ago now. Has not received flu vaccine yet.   He has not picked up the amlodipine prescription from June. Does not know much about blood pressure. Non smoker, non drinker.  Monogamous sexual relationship with his wife.     Review of Systems  Constitutional: Negative for chills, fever, malaise/fatigue and weight loss.  HENT: Negative for sore throat.        No dental problems  Respiratory: Negative for cough and sputum production.   Cardiovascular: Negative for chest pain and leg swelling.  Gastrointestinal: Negative for abdominal pain, diarrhea and vomiting.  Genitourinary: Negative for dysuria and flank pain.  Musculoskeletal: Negative for joint pain, myalgias and neck pain.  Skin: Negative for rash.  Neurological: Negative for dizziness, tingling and headaches.    Psychiatric/Behavioral: Negative for depression and substance abuse. The patient is not nervous/anxious and does not have insomnia.     Past Medical History:  Diagnosis Date  . Healthcare maintenance   . History of syphilis   . HIV infection ([place])   . Inguinal hernia of left side without obstruction or gangrene     Outpatient Medications Prior to Visit  Medication Sig Dispense Refill  . bictegravir-emtricitabine-tenofovir AF (BIKTARVY) 50-200-25 MG TABS tablet Take 1 tablet by mouth daily. 30 tablet 11  . elvitegravir-cobicistat-emtricitabine-tenofovir (GENVOYA) 150-150-200-10 MG TABS tablet Take 1 tablet by mouth daily with breakfast. (Patient not taking: Reported on 07/10/2019) 30 tablet 5  . amLODipine (NORVASC) 5 MG tablet Take 1 tablet (5 mg total) by mouth daily. (Patient not taking: Reported on 10/30/2019) 30 tablet 11   No facility-administered medications prior to visit.     Allergies  Allergen Reactions  . Abacavir     HLA B*5701 (+)    Social History   Tobacco Use  . Smoking status: Never Smoker  . Smokeless tobacco: Never Used  Substance Use Topics  . Alcohol use: Never  . Drug use: Never    Social History   Substance and Sexual Activity  Sexual Activity Yes  . Partners: Female     Objective:   Vitals:   10/30/19 1005  BP: (!) 163/106  Pulse: 60  Temp: 97.9 F (36.6 C)  TempSrc: Oral  SpO2: 98%  Weight: 205 lb (93 kg)   There is no height  or weight on file to calculate BMI.  Physical Exam Constitutional:      Appearance: Normal appearance. He is not ill-appearing.  HENT:     Head: Normocephalic.     Mouth/Throat:     Mouth: Mucous membranes are moist.     Pharynx: Oropharynx is clear.  Eyes:     General: No scleral icterus. Pulmonary:     Effort: Pulmonary effort is normal.  Musculoskeletal:        General: Normal range of motion.     Cervical back: Normal range of motion.  Skin:    Coloration: Skin is not jaundiced or pale.   Neurological:     Mental Status: He is alert and oriented to person, place, and time.  Psychiatric:        Mood and Affect: Mood normal.        Judgment: Judgment normal.      Lab Results Lab Results  Component Value Date   WBC 4.4 12/11/2018   HGB 14.8 12/11/2018   HCT 43.4 12/11/2018   MCV 89.3 12/11/2018   PLT 216 12/11/2018    Lab Results  Component Value Date   CREATININE 0.83 07/10/2019   BUN 14 07/10/2019   NA 139 07/10/2019   K 4.2 07/10/2019   CL 103 07/10/2019   CO2 28 07/10/2019    Lab Results  Component Value Date   ALT 19 07/10/2019   AST 23 07/10/2019   BILITOT 0.4 07/10/2019    Lab Results  Component Value Date   CHOL 173 05/08/2018   HDL 38 (L) 05/08/2018   LDLCALC 104 (H) 05/08/2018   TRIG 185 (H) 05/08/2018   CHOLHDL 4.6 05/08/2018   HIV 1 RNA Quant (copies/mL)  Date Value  07/10/2019 80 (H)  12/11/2018 <20 NOT DETECTED  05/08/2018 <20 NOT DETECTED   CD4 T Cell Abs (/uL)  Date Value  07/10/2019 466  12/11/2018 599  05/08/2018 350 (L)     Assessment & Plan:   Problem List Items Addressed This Visit      Unprioritized   Hypertension    He has not started the Amlodipine yet - will try again and get [facility] to send automatically monthly with his Biktarvy.  Discussed and counseled on BP and natural progression of the disease.  He does not smoke or drink alcohol.  Will have him back in 3 months to titrate for him.   BP Readings from Last 3 Encounters:  10/30/19 (!) 163/106  07/10/19 (!) 155/115  12/26/18 (!) 143/104         Relevant Medications   amLODipine (NORVASC) 5 MG tablet   HIV infection ([place]) (Chronic)    Doing well on Biktarvy with undetectable viral loads, however given challenges with accessing medication, he has had a few lapses. We spent time with our pharmacy patient advocate to arrange shipping medications monthly.  Will defer labs today  Flu shot updated.  COVID Vaccine recorded in chart.  Routine STI  screening not necessary.  RTC in 4mto follow up on BP and check labs at that time.       Healthcare maintenance    Flu shot to be given today.        Other Visit Diagnoses    Need for immunization against influenza    -  Primary   Relevant Orders   Flu Vaccine QUAD 36+ mos IM (Completed)      SJanene Madeira MSN, NP-C [facility] Pager: [phone] Office: [phone]  10/30/19  9:46 PM

## 2019-10-30 NOTE — Assessment & Plan Note (Addendum)
He has not started the Amlodipine yet - will try again and get [facility] to send automatically monthly with his Biktarvy.  Discussed and counseled on BP and natural progression of the disease.  He does not smoke or drink alcohol.  Will have him back in 3 months to titrate for him.   BP Readings from Last 3 Encounters:  10/30/19 (!) 163/106  07/10/19 (!) 155/115  12/26/18 (!) 143/104

## 2019-12-09 ENCOUNTER — Encounter: Payer: Self-pay | Admitting: Infectious Diseases

## 2020-01-20 ENCOUNTER — Telehealth: Payer: Self-pay | Admitting: *Deleted

## 2020-01-20 ENCOUNTER — Other Ambulatory Visit: Payer: Self-pay

## 2020-01-20 ENCOUNTER — Encounter: Payer: Self-pay | Admitting: Infectious Diseases

## 2020-01-20 ENCOUNTER — Ambulatory Visit (INDEPENDENT_AMBULATORY_CARE_PROVIDER_SITE_OTHER): Payer: Self-pay | Admitting: Infectious Diseases

## 2020-01-20 VITALS — BP 152/106 | HR 65 | Temp 98.2°F | Wt 200.0 lb

## 2020-01-20 DIAGNOSIS — Z21 Asymptomatic human immunodeficiency virus [HIV] infection status: Secondary | ICD-10-CM

## 2020-01-20 NOTE — Telephone Encounter (Signed)
RN contacted [employer] at [phone] to request interpreter for patient's appointment 12/22 at 10:00 with Cassie.  [employer] unable to assist with [facility] interpreter any longer, [employer] has limited [place] availability and needs to schedule these appointments ahead of time. Email sent to confirm request, reference number [id number]. They will follow up when interpreter has been confirmed for this appointment. We are encouraged to call back to make sure there is an interpreter available if we haven't heard by 12/20. RN routed email to [facility] for information. Andree Coss, RN

## 2020-01-28 ENCOUNTER — Ambulatory Visit (INDEPENDENT_AMBULATORY_CARE_PROVIDER_SITE_OTHER): Payer: Self-pay | Admitting: Pharmacist

## 2020-01-28 ENCOUNTER — Telehealth: Payer: Self-pay

## 2020-01-28 ENCOUNTER — Other Ambulatory Visit: Payer: Self-pay

## 2020-01-28 DIAGNOSIS — Z21 Asymptomatic human immunodeficiency virus [HIV] infection status: Secondary | ICD-10-CM

## 2020-01-28 MED ORDER — AMLODIPINE BESYLATE 5 MG PO TABS: 5.0000 mg | ORAL_TABLET | Freq: Every day | ORAL | 11 refills | Status: DC

## 2020-01-28 MED ORDER — BIKTARVY 50-200-25 MG PO TABS: 1.0000 | ORAL_TABLET | Freq: Every day | ORAL | 11 refills | Status: DC

## 2020-01-28 NOTE — Telephone Encounter (Signed)
[facility] Patient Advocate Encounter   I called [facility] and set up patient refills on both medications to be delivered here to the clinic on 12/28.  Clearance Coots, CPhT Specialty Pharmacy Patient [facility] for Infectious Disease Phone: [phone] Fax:  [phone]

## 2020-01-28 NOTE — Progress Notes (Signed)
HPI: Eric Keith is a 44 y.o. male who presents to the [place] clinic for HIV follow-up.  Patient Active Problem List   Diagnosis Date Noted   Hypertension 07/10/2019   History of syphilis    HIV infection ([place])    Healthcare maintenance     Patient's Medications  New Prescriptions   No medications on file  Previous Medications   AMLODIPINE (NORVASC) 5 MG TABLET    Take 1 tablet (5 mg total) by mouth daily.   BICTEGRAVIR-EMTRICITABINE-TENOFOVIR AF (BIKTARVY) 50-200-25 MG TABS TABLET    Take 1 tablet by mouth daily.  Modified Medications   No medications on file  Discontinued Medications   No medications on file    Allergies: Allergies  Allergen Reactions   Abacavir     HLA B*5701 (+)    Past Medical History: Past Medical History:  Diagnosis Date   Healthcare maintenance    History of syphilis    HIV infection ([place])    Inguinal hernia of left side without obstruction or gangrene     Social History: Social History   Socioeconomic History   Marital status: Married    Spouse name: Not on file   Number of children: Not on file   Years of education: Not on file   Highest education level: Not on file  Occupational History   Not on file  Tobacco Use   Smoking status: Never Smoker   Smokeless tobacco: Never Used  Substance and Sexual Activity   Alcohol use: Never   Drug use: Never   Sexual activity: Yes    Partners: Female  Other Topics Concern   Not on file  Social History Narrative   Not on file   Social Determinants of Health   Financial Resource Strain: Not on file  Food Insecurity: Not on file  Transportation Needs: Not on file  Physical Activity: Not on file  Stress: Not on file  Social Connections: Not on file    Labs: Lab Results  Component Value Date   HIV1RNAQUANT 80 (H) 07/10/2019   HIV1RNAQUANT <20 NOT DETECTED 12/11/2018   HIV1RNAQUANT <20 NOT DETECTED 05/08/2018   CD4TABS 466 07/10/2019    CD4TABS 599 12/11/2018   CD4TABS 350 (L) 05/08/2018    RPR and STI Lab Results  Component Value Date   LABRPR REACTIVE (A) 05/08/2018   RPRTITER 1:1 (H) 05/08/2018    STI Results GC CT  05/08/2018 Negative Negative    Hepatitis B Lab Results  Component Value Date   HEPBSAB NON-REACTIVE 05/08/2018   HEPBSAG NON-REACTIVE 05/08/2018   HEPBCAB REACTIVE (A) 05/08/2018   Hepatitis C Lab Results  Component Value Date   HEPCAB NON-REACTIVE 05/08/2018   Hepatitis A Lab Results  Component Value Date   HAV REACTIVE (A) 05/08/2018   Lipids: Lab Results  Component Value Date   CHOL 173 05/08/2018   TRIG 185 (H) 05/08/2018   HDL 38 (L) 05/08/2018   CHOLHDL 4.6 05/08/2018   LDLCALC 104 (H) 05/08/2018    Current HIV Regimen: Biktarvy  Assessment: Eric Keith is here today to follow up for HIV and blood pressure management. A Sango pac[place] interpreter was used for this encounter.  Eric Keith comes in today and tells me that he hasn't taken his Biktarvy in 9 months due to not being able to get it from the pharmacy. He has Eric Keith and must fill his prescriptions through [facility] [facility]. He is very anxious and upset because of this. He is worried what will happen  to him if he does not take his medications. He has also not received his amlodipine either.   We called [facility] and they do not have a [place] interpreter available. Therefore, we set his medications up to ship to our clinic each month for him to pick up. He states this will work for him and will cause him less anxiety. [facility] will call our pharmacy line and speak to Eric Keith or myself each month to get shipment confirmed. We will then reach out to him to come and pick up.  I gave him a month's worth of Biktarvy samples today to last him until he will see Eric Keith again in ~3-4 weeks. Hopefully by then his medications will be here for him to start taking and we can set up his next shipment. He is very thankful and happy to  leave with medication in his hand. Will defer labs or BP check today since he hasn't been taking anything. I will scheduled him to see Eric Keith next month. The pacific interpreter that helped translate today suggested that we schedule an interpreter for each visit as they are not available on-demand usually. Will tell Eric Lull, RN who helped set this one up.  Medication Samples have been provided to the patient.  Drug name: Biktarvy        Strength: 50/200/25 mg       Qty: 28 days; 4 bottles  LOT: [id number]  Exp.Date: 03/28/22  Dosing instructions: Take one tablet by mouth once daily  The patient has been instructed regarding the correct time, dose, and frequency of taking this medication, including desired effects and most common side effects.   Plan: - Biktarvy samples given - [facility] to mail medications to clinic each month - F/u with Eric Keith 1/14 at 11:15am  Eric Keith, PharmD, BCIDP, AAHIVP, CPP Clinical Pharmacist Practitioner Infectious Diseases [facility] 01/28/2020, 10:32 AM

## 2020-01-29 ENCOUNTER — Ambulatory Visit: Payer: Medicaid Other | Admitting: Infectious Diseases

## 2020-02-03 ENCOUNTER — Telehealth: Payer: Self-pay | Admitting: *Deleted

## 2020-02-03 NOTE — Telephone Encounter (Signed)
Patient's biktarvy and amlodipine were delivered to [facility] from [facility] in [place].  RN called patient to notify him. Patient had his daughter come to the phone to Patient will come to the office to pick up his medication.  Patient is scheduled to follow up with [facility] 02/20/19 11:!5. RN requested S[place] interpreter via [employer] for this appointment. Confirmation to follow.  Andree Coss, RN

## 2020-02-20 ENCOUNTER — Ambulatory Visit: Payer: Medicaid Other | Admitting: Infectious Diseases

## 2020-03-30 NOTE — Progress Notes (Signed)
No interpreter available.  Patient will reschedule

## 2020-08-13 ENCOUNTER — Other Ambulatory Visit: Payer: Self-pay

## 2020-08-13 DIAGNOSIS — Z79899 Other long term (current) drug therapy: Secondary | ICD-10-CM

## 2020-08-13 DIAGNOSIS — Z113 Encounter for screening for infections with a predominantly sexual mode of transmission: Secondary | ICD-10-CM

## 2020-08-13 DIAGNOSIS — B20 Human immunodeficiency virus [HIV] disease: Secondary | ICD-10-CM

## 2020-08-19 ENCOUNTER — Other Ambulatory Visit: Payer: BC Managed Care – PPO

## 2020-08-19 ENCOUNTER — Other Ambulatory Visit (HOSPITAL_COMMUNITY)
Admission: RE | Admit: 2020-08-19 | Discharge: 2020-08-19 | Disposition: A | Discharge status: Home / Self Care | Payer: BC Managed Care – PPO | Source: Ambulatory Visit | Attending: Infectious Diseases | Admitting: Infectious Diseases

## 2020-08-19 ENCOUNTER — Other Ambulatory Visit: Payer: Self-pay

## 2020-08-19 DIAGNOSIS — Z79899 Other long term (current) drug therapy: Secondary | ICD-10-CM

## 2020-08-19 DIAGNOSIS — Z113 Encounter for screening for infections with a predominantly sexual mode of transmission: Secondary | ICD-10-CM | POA: Insufficient documentation

## 2020-08-19 DIAGNOSIS — B20 Human immunodeficiency virus [HIV] disease: Secondary | ICD-10-CM | POA: Insufficient documentation

## 2020-08-20 LAB — URINE CYTOLOGY ANCILLARY ONLY
Chlamydia: NEGATIVE
Comment: NEGATIVE
Comment: NORMAL
Neisseria Gonorrhea: NEGATIVE

## 2020-08-20 LAB — T-HELPER CELL (CD4) - (RCID CLINIC ONLY)
CD4 % Helper T Cell: 33 % (ref 33–65)
CD4 T Cell Abs: 517 /uL (ref 400–1790)

## 2020-08-23 LAB — COMPLETE METABOLIC PANEL WITH GFR
AG Ratio: 1.2 (calc) (ref 1.0–2.5)
ALT: 22 U/L (ref 9–46)
AST: 24 U/L (ref 10–40)
Albumin: 4 g/dL (ref 3.6–5.1)
Alkaline phosphatase (APISO): 60 U/L (ref 36–130)
BUN: 12 mg/dL (ref 7–25)
CO2: 28 mmol/L (ref 20–32)
Calcium: 9.2 mg/dL (ref 8.6–10.3)
Chloride: 104 mmol/L (ref 98–110)
Creat: 0.94 mg/dL (ref 0.60–1.29)
Globulin: 3.4 g/dL (calc) (ref 1.9–3.7)
Glucose, Bld: 100 mg/dL — ABNORMAL HIGH (ref 65–99)
Potassium: 3.6 mmol/L (ref 3.5–5.3)
Sodium: 139 mmol/L (ref 135–146)
Total Bilirubin: 0.5 mg/dL (ref 0.2–1.2)
Total Protein: 7.4 g/dL (ref 6.1–8.1)
eGFR: 103 mL/min/{1.73_m2} (ref >=60)

## 2020-08-23 LAB — CBC WITH DIFFERENTIAL/PLATELET
Absolute Monocytes: 287 cells/uL (ref 200–950)
Basophils Absolute: 32 cells/uL (ref 0–200)
Basophils Relative: 0.9 %
Eosinophils Absolute: 287 cells/uL (ref 15–500)
Eosinophils Relative: 8.2 %
HCT: 41.3 % (ref 38.5–50.0)
Hemoglobin: 13.7 g/dL (ref 13.2–17.1)
Lymphs Abs: 1799 cells/uL (ref 850–3900)
MCH: 29.1 pg (ref 27.0–33.0)
MCHC: 33.2 g/dL (ref 32.0–36.0)
MCV: 87.9 fL (ref 80.0–100.0)
MPV: 9.6 fL (ref 7.5–12.5)
Monocytes Relative: 8.2 %
Neutro Abs: 1096 cells/uL — ABNORMAL LOW (ref 1500–7800)
Neutrophils Relative %: 31.3 %
Platelets: 284 10*3/uL (ref 140–400)
RBC: 4.7 10*6/uL (ref 4.20–5.80)
RDW: 13 % (ref 11.0–15.0)
Total Lymphocyte: 51.4 %
WBC: 3.5 10*3/uL — ABNORMAL LOW (ref 3.8–10.8)

## 2020-08-23 LAB — LIPID PANEL
Cholesterol: 162 mg/dL (ref <200)
HDL: 35 mg/dL — ABNORMAL LOW (ref >=40)
LDL Cholesterol (Calc): 111 mg/dL (calc) — ABNORMAL HIGH
Non-HDL Cholesterol (Calc): 127 mg/dL (calc) (ref <130)
Total CHOL/HDL Ratio: 4.6 (calc) (ref <5.0)
Triglycerides: 74 mg/dL (ref <150)

## 2020-08-23 LAB — HIV-1 RNA QUANT-NO REFLEX-BLD
HIV 1 RNA Quant: 27 Copies/mL — ABNORMAL HIGH
HIV-1 RNA Quant, Log: 1.43 Log cps/mL — ABNORMAL HIGH

## 2020-08-23 LAB — RPR: RPR Ser Ql: REACTIVE — AB

## 2020-08-23 LAB — FLUORESCENT TREPONEMAL AB(FTA)-IGG-BLD: Fluorescent Treponemal ABS: REACTIVE — AB

## 2020-08-23 LAB — RPR TITER: RPR Titer: 1:2 {titer} — ABNORMAL HIGH

## 2020-09-02 ENCOUNTER — Ambulatory Visit (INDEPENDENT_AMBULATORY_CARE_PROVIDER_SITE_OTHER): Payer: BC Managed Care – PPO

## 2020-09-02 ENCOUNTER — Other Ambulatory Visit (HOSPITAL_COMMUNITY): Payer: Self-pay

## 2020-09-02 ENCOUNTER — Encounter: Payer: Self-pay | Admitting: Infectious Diseases

## 2020-09-02 ENCOUNTER — Other Ambulatory Visit: Payer: Self-pay

## 2020-09-02 ENCOUNTER — Ambulatory Visit (INDEPENDENT_AMBULATORY_CARE_PROVIDER_SITE_OTHER): Payer: BC Managed Care – PPO | Admitting: Infectious Diseases

## 2020-09-02 DIAGNOSIS — Z8619 Personal history of other infectious and parasitic diseases: Secondary | ICD-10-CM | POA: Diagnosis not present

## 2020-09-02 DIAGNOSIS — Z21 Asymptomatic human immunodeficiency virus [HIV] infection status: Secondary | ICD-10-CM | POA: Diagnosis not present

## 2020-09-02 DIAGNOSIS — Z23 Encounter for immunization: Secondary | ICD-10-CM | POA: Diagnosis not present

## 2020-09-02 DIAGNOSIS — Z0000 Encounter for general adult medical examination without abnormal findings: Secondary | ICD-10-CM

## 2020-09-02 MED ORDER — BIKTARVY 50-200-25 MG PO TABS: 1.0000 | ORAL_TABLET | Freq: Every day | ORAL | 11 refills | Status: DC

## 2020-09-02 NOTE — Progress Notes (Signed)
Name: Eric Keith  DOB: October 12, 1975 MRN: [id number] PCP: Patient, No Pcp Per (Inactive)    Brief Narrative:  Eric Keith is a 45 y.o. male with HIV disease, Dx at a rescue camp in [place] 2005. He did not start on medications right away but when he arrived here in [place] in 2016 he his since been on antiretroviral therapy.  CD4 nadir unknown VL unknown HIV Risk: geographic  History of OIs: Unknown Intake Labs 05/2018 Hep B sAg (-), sAb (-), cAb (-); Hep A (+), Hep C (-) Quantiferon (-) HLA B*5701 (+) G6PD: ()  Previous Regimens: Genvoya >> suppressed Biktarvy   Genotypes: 2017: wildtype  Subjective:  CC: HIV follow up care - out of medications x 7 months   HPI: Interpretor unavailable for today's visit. His daughter Eric Keith can interpret simple things for him. He has been out of his medications for 7 months now. Was receiving some shipments then they stopped coming. He has not been able to investigate as to why this stopped. Has not had follow up here since December when he was getting meds.     Review of Systems  Constitutional:  Negative for chills, fever, malaise/fatigue and weight loss.  HENT:  Negative for sore throat.        No dental problems  Respiratory:  Negative for cough and sputum production.   Cardiovascular:  Negative for chest pain and leg swelling.  Gastrointestinal:  Negative for abdominal pain, diarrhea and vomiting.  Genitourinary:  Negative for dysuria and flank pain.  Musculoskeletal:  Negative for joint pain, myalgias and neck pain.  Skin:  Negative for rash.  Neurological:  Negative for dizziness, tingling and headaches.  Psychiatric/Behavioral:  Negative for depression and substance abuse. The patient is not nervous/anxious and does not have insomnia.     Past Medical History:  Diagnosis Date   Healthcare maintenance    History of syphilis    HIV infection ([place])    Inguinal hernia of left side without obstruction or gangrene      Outpatient Medications Prior to Visit  Medication Sig Dispense Refill   amLODipine (NORVASC) 5 MG tablet Take 1 tablet (5 mg total) by mouth daily. 30 tablet 11   bictegravir-emtricitabine-tenofovir AF (BIKTARVY) 50-200-25 MG TABS tablet Take 1 tablet by mouth daily. 30 tablet 11   No facility-administered medications prior to visit.     Allergies  Allergen Reactions   Abacavir     HLA B*5701 (+)    Social History   Tobacco Use   Smoking status: Never   Smokeless tobacco: Never  Substance Use Topics   Alcohol use: Never   Drug use: Never    Social History   Substance and Sexual Activity  Sexual Activity Yes   Partners: Female     Objective:   Vitals:   09/02/20 0941  BP: (!) 132/94  Pulse: 69  Temp: 98.2 F (36.8 C)  TempSrc: Oral  SpO2: 97%  Weight: 200 lb (90.7 kg)   There is no height or weight on file to calculate BMI.  Physical Exam Constitutional:      Appearance: Normal appearance. He is not ill-appearing.  HENT:     Head: Normocephalic.     Mouth/Throat:     Mouth: Mucous membranes are moist.     Pharynx: Oropharynx is clear.  Eyes:     General: No scleral icterus. Pulmonary:     Effort: Pulmonary effort is normal.  Musculoskeletal:  General: Normal range of motion.     Cervical back: Normal range of motion.  Skin:    Coloration: Skin is not jaundiced or pale.  Neurological:     Mental Status: He is alert and oriented to person, place, and time.  Psychiatric:        Mood and Affect: Mood normal.        Judgment: Judgment normal.     Lab Results Lab Results  Component Value Date   WBC 3.5 (L) 08/19/2020   HGB 13.7 08/19/2020   HCT 41.3 08/19/2020   MCV 87.9 08/19/2020   PLT 284 08/19/2020    Lab Results  Component Value Date   CREATININE 0.94 08/19/2020   BUN 12 08/19/2020   NA 139 08/19/2020   K 3.6 08/19/2020   CL 104 08/19/2020   CO2 28 08/19/2020    Lab Results  Component Value Date   ALT 22 08/19/2020    AST 24 08/19/2020   BILITOT 0.5 08/19/2020    Lab Results  Component Value Date   CHOL 162 08/19/2020   HDL 35 (L) 08/19/2020   LDLCALC 111 (H) 08/19/2020   TRIG 74 08/19/2020   CHOLHDL 4.6 08/19/2020   HIV 1 RNA Quant  Date Value  08/19/2020 27 Copies/mL (H)  07/10/2019 80 copies/mL (H)  12/11/2018 <20 NOT DETECTED copies/mL   CD4 T Cell Abs (/uL)  Date Value  08/19/2020 517  07/10/2019 466  12/11/2018 599     Assessment & Plan:   Problem List Items Addressed This Visit       Unprioritized   HIV infection ([place]) ([place]ronic)    Again has not been receiving Biktarvy, which continues to be frustrating. I have requested that the pharmacy contact his daughter in the afternoon to verify shipment. Alternatively if we can set up with [facility] case manager this will suffice as proxy to verify shipment per patient's required pharmacy. He and his family will meet with Brianna with [facility] today.  Interestingly his viral load continues to be undetectable, despite being off medications for some time.  Return in about 3 months (around 12/03/2020). appt was made and communicated today. They will need transportation assistance.        History of syphilis    Stable titer indicating no new infection, only serofast state. Continue to follow annually. Monogamous with male partner.        Healthcare maintenance    Needs PCP - have tried to refer him a few times but this always falls through. Likely d/t language barrier and transportation barriers.  Recommended COVID booster today - received.        Other Visit Diagnoses     Asymptomatic HIV infection ([place])          Janene Madeira, MSN, NP-C [facility] for [facility] [place] Pager: [phone] Office: [phone]  09/15/20  3:03 PM

## 2020-09-02 NOTE — Progress Notes (Signed)
   Covid-19 Vaccination Clinic  Name:  Eric Keith    MRN: [id number] DOB: [birth year]  09/02/2020  Mr. Eric Keith was observed post Covid-19 immunization for 15 minutes without incident. He was provided with Vaccine Information Sheet and instruction to access the V-Safe system.   Mr. Eric Keith was instructed to call 911 with any severe reactions post vaccine: Difficulty breathing  Swelling of face and throat  A fast heartbeat  A bad rash all over body  Dizziness and weakness     Juanita Laster, RMA

## 2020-09-03 ENCOUNTER — Other Ambulatory Visit: Payer: Self-pay

## 2020-09-03 DIAGNOSIS — Z21 Asymptomatic human immunodeficiency virus [HIV] infection status: Secondary | ICD-10-CM

## 2020-09-03 MED ORDER — BIKTARVY 50-200-25 MG PO TABS: 1.0000 | ORAL_TABLET | Freq: Every day | ORAL | 5 refills | Status: DC

## 2020-09-15 ENCOUNTER — Encounter: Payer: Self-pay | Admitting: Infectious Diseases

## 2020-09-15 NOTE — Assessment & Plan Note (Signed)
Again has not been receiving Biktarvy, which continues to be frustrating. I have requested that the pharmacy contact his daughter in the afternoon to verify shipment. Alternatively if we can set up with [facility] case manager this will suffice as proxy to verify shipment per patient's required pharmacy. He and his family will meet with Brianna with [facility] today.  Interestingly his viral load continues to be undetectable, despite being off medications for some time.  Return in about 3 months (around 12/03/2020). appt was made and communicated today. They will need transportation assistance.

## 2020-09-15 NOTE — Assessment & Plan Note (Signed)
Needs PCP - have tried to refer him a few times but this always falls through. Likely d/t language barrier and transportation barriers.  Recommended COVID booster today - received.

## 2020-09-15 NOTE — Assessment & Plan Note (Signed)
Stable titer indicating no new infection, only serofast state. Continue to follow annually. Monogamous with male partner.

## 2020-11-26 ENCOUNTER — Telehealth: Payer: Self-pay | Admitting: *Deleted

## 2020-11-26 NOTE — Telephone Encounter (Signed)
Sango interpreter requested for upcoming appointment on 11/29/20 at 9:30. Confirmation will be emailed to [place].  [employer] does have a Sales executive available, but only by appointment. To schedule this, please contact [employer] at [phone] at least 1 week in advance. The interpreter will be on standby 15 minutes before and after the scheduled appointment time. This can be for phone or in-person appointments. They can also be reached at [email or web address] or the main phone number of [phone]. Andree Coss, RN

## 2020-11-29 ENCOUNTER — Other Ambulatory Visit: Payer: BC Managed Care – PPO

## 2020-11-29 ENCOUNTER — Other Ambulatory Visit: Payer: Self-pay

## 2020-11-29 ENCOUNTER — Ambulatory Visit: Payer: BC Managed Care – PPO | Admitting: Pharmacist

## 2020-11-29 ENCOUNTER — Other Ambulatory Visit (HOSPITAL_COMMUNITY)
Admission: RE | Admit: 2020-11-29 | Discharge: 2020-11-29 | Disposition: A | Discharge status: Home / Self Care | Payer: BC Managed Care – PPO | Source: Ambulatory Visit | Attending: Infectious Diseases | Admitting: Infectious Diseases

## 2020-11-29 DIAGNOSIS — Z113 Encounter for screening for infections with a predominantly sexual mode of transmission: Secondary | ICD-10-CM | POA: Insufficient documentation

## 2020-11-29 DIAGNOSIS — Z0000 Encounter for general adult medical examination without abnormal findings: Secondary | ICD-10-CM

## 2020-11-29 DIAGNOSIS — Z21 Asymptomatic human immunodeficiency virus [HIV] infection status: Secondary | ICD-10-CM

## 2020-11-30 LAB — URINE CYTOLOGY ANCILLARY ONLY
Chlamydia: NEGATIVE
Comment: NEGATIVE
Comment: NORMAL
Neisseria Gonorrhea: NEGATIVE

## 2020-11-30 LAB — T-HELPER CELL (CD4) - (RCID CLINIC ONLY)
CD4 % Helper T Cell: 29 % — ABNORMAL LOW (ref 33–65)
CD4 T Cell Abs: 364 /uL — ABNORMAL LOW (ref 400–1790)

## 2020-12-01 LAB — RPR TITER: RPR Titer: 1:2 {titer} — ABNORMAL HIGH

## 2020-12-01 LAB — COMPREHENSIVE METABOLIC PANEL
AG Ratio: 1.3 (calc) (ref 1.0–2.5)
ALT: 21 U/L (ref 9–46)
AST: 23 U/L (ref 10–40)
Albumin: 4 g/dL (ref 3.6–5.1)
Alkaline phosphatase (APISO): 55 U/L (ref 36–130)
BUN: 18 mg/dL (ref 7–25)
CO2: 27 mmol/L (ref 20–32)
Calcium: 9.5 mg/dL (ref 8.6–10.3)
Chloride: 106 mmol/L (ref 98–110)
Creat: 0.81 mg/dL (ref 0.60–1.29)
Globulin: 3 g/dL (calc) (ref 1.9–3.7)
Glucose, Bld: 124 mg/dL — ABNORMAL HIGH (ref 65–99)
Potassium: 4.3 mmol/L (ref 3.5–5.3)
Sodium: 140 mmol/L (ref 135–146)
Total Bilirubin: 0.4 mg/dL (ref 0.2–1.2)
Total Protein: 7 g/dL (ref 6.1–8.1)

## 2020-12-01 LAB — CBC WITH DIFFERENTIAL/PLATELET
Absolute Monocytes: 383 cells/uL (ref 200–950)
Basophils Absolute: 20 cells/uL (ref 0–200)
Basophils Relative: 0.4 %
Eosinophils Absolute: 423 cells/uL (ref 15–500)
Eosinophils Relative: 8.3 %
HCT: 43.5 % (ref 38.5–50.0)
Hemoglobin: 14.8 g/dL (ref 13.2–17.1)
Lymphs Abs: 1362 cells/uL (ref 850–3900)
MCH: 30.8 pg (ref 27.0–33.0)
MCHC: 34 g/dL (ref 32.0–36.0)
MCV: 90.4 fL (ref 80.0–100.0)
MPV: 10.3 fL (ref 7.5–12.5)
Monocytes Relative: 7.5 %
Neutro Abs: 2912 cells/uL (ref 1500–7800)
Neutrophils Relative %: 57.1 %
Platelets: 186 10*3/uL (ref 140–400)
RBC: 4.81 10*6/uL (ref 4.20–5.80)
RDW: 13 % (ref 11.0–15.0)
Total Lymphocyte: 26.7 %
WBC: 5.1 10*3/uL (ref 3.8–10.8)

## 2020-12-01 LAB — HIV-1 RNA QUANT-NO REFLEX-BLD
HIV 1 RNA Quant: NOT DETECTED Copies/mL
HIV-1 RNA Quant, Log: NOT DETECTED Log cps/mL

## 2020-12-01 LAB — RPR: RPR Ser Ql: REACTIVE — AB

## 2020-12-01 LAB — LIPID PANEL
Cholesterol: 156 mg/dL (ref <200)
HDL: 46 mg/dL (ref >=40)
LDL Cholesterol (Calc): 96 mg/dL (calc)
Non-HDL Cholesterol (Calc): 110 mg/dL (calc) (ref <130)
Total CHOL/HDL Ratio: 3.4 (calc) (ref <5.0)
Triglycerides: 56 mg/dL (ref <150)

## 2020-12-01 LAB — FLUORESCENT TREPONEMAL AB(FTA)-IGG-BLD: Fluorescent Treponemal ABS: REACTIVE — AB

## 2020-12-03 ENCOUNTER — Ambulatory Visit: Payer: BC Managed Care – PPO | Admitting: Infectious Diseases

## 2020-12-14 ENCOUNTER — Other Ambulatory Visit (HOSPITAL_COMMUNITY): Payer: Self-pay

## 2020-12-14 ENCOUNTER — Ambulatory Visit (INDEPENDENT_AMBULATORY_CARE_PROVIDER_SITE_OTHER): Payer: BC Managed Care – PPO | Admitting: Pharmacist

## 2020-12-14 ENCOUNTER — Other Ambulatory Visit: Payer: Self-pay

## 2020-12-14 DIAGNOSIS — B20 Human immunodeficiency virus [HIV] disease: Secondary | ICD-10-CM | POA: Diagnosis not present

## 2020-12-14 DIAGNOSIS — Z0000 Encounter for general adult medical examination without abnormal findings: Secondary | ICD-10-CM

## 2020-12-14 NOTE — Progress Notes (Signed)
12/14/2020  HPI: Eric Keith is a 45 y.o. male who presents to the [place] clinic for HIV follow-up.  Patient Active Problem List   Diagnosis Date Noted   Hypertension 07/10/2019   History of syphilis    HIV infection ([place])    Healthcare maintenance     Patient's Medications  New Prescriptions   No medications on file  Previous Medications   AMLODIPINE (NORVASC) 5 MG TABLET    Take 1 tablet (5 mg total) by mouth daily.   BICTEGRAVIR-EMTRICITABINE-TENOFOVIR AF (BIKTARVY) 50-200-25 MG TABS TABLET    Take 1 tablet by mouth daily.  Modified Medications   No medications on file  Discontinued Medications   No medications on file    Allergies: Allergies  Allergen Reactions   Abacavir     HLA B*5701 (+)    Past Medical History: Past Medical History:  Diagnosis Date   Healthcare maintenance    History of syphilis    HIV infection ([place])    Inguinal hernia of left side without obstruction or gangrene     Social History: Social History   Socioeconomic History   Marital status: Married    Spouse name: Not on file   Number of children: Not on file   Years of education: Not on file   Highest education level: Not on file  Occupational History   Not on file  Tobacco Use   Smoking status: Never   Smokeless tobacco: Never  Substance and Sexual Activity   Alcohol use: Never   Drug use: Never   Sexual activity: Yes    Partners: Female  Other Topics Concern   Not on file  Social History Narrative   Not on file   Social Determinants of Health   Financial Resource Strain: Not on file  Food Insecurity: Not on file  Transportation Needs: Not on file  Physical Activity: Not on file  Stress: Not on file  Social Connections: Not on file    Labs: Lab Results  Component Value Date   HIV1RNAQUANT Not Detected 11/29/2020   HIV1RNAQUANT 27 (H) 08/19/2020   HIV1RNAQUANT 80 (H) 07/10/2019   CD4TABS 364 (L) 11/29/2020   CD4TABS 517 08/19/2020   CD4TABS  466 07/10/2019    RPR and STI Lab Results  Component Value Date   LABRPR REACTIVE (A) 11/29/2020   LABRPR REACTIVE (A) 08/19/2020   LABRPR REACTIVE (A) 05/08/2018   RPRTITER 1:2 (H) 11/29/2020   RPRTITER 1:2 (H) 08/19/2020   RPRTITER 1:1 (H) 05/08/2018    STI Results GC CT  11/29/2020 Negative Negative  08/19/2020 Negative Negative  05/08/2018 Negative Negative    Hepatitis B Lab Results  Component Value Date   HEPBSAB NON-REACTIVE 05/08/2018   HEPBSAG NON-REACTIVE 05/08/2018   HEPBCAB REACTIVE (A) 05/08/2018   Hepatitis C Lab Results  Component Value Date   HEPCAB NON-REACTIVE 05/08/2018   Hepatitis A Lab Results  Component Value Date   HAV REACTIVE (A) 05/08/2018   Lipids: Lab Results  Component Value Date   CHOL 156 11/29/2020   TRIG 56 11/29/2020   HDL 46 11/29/2020   CHOLHDL 3.4 11/29/2020   [place] 96 11/29/2020    Current HIV Regimen: Biktarvy  Assessment: Eric Keith presents to clinic today for his HIV follow-up appointment. Unfortunately, a Sango interpretor is not available for today's visit. He wife is able to speak limited English and was able to interpret for him in the room. He takes Airline pilot daily for HIV and expresses that he has  been out of medication since last Friday. He receives [employer] via [employer] with [facility], dispensing. I asked if he had received any calls for shipments and he said no. He would like for his daughter Eric Keith to to be the point of contact for future shipments. Her phone number is [phone]. We will call [facility] and get this set up for him. In the meantime, we provided him with 2 weeks of Biktarvay samples to ensure he is not off of medication. I will follow up in 2 weeks to ensure he has received Biktarvy via mail.  Patient expressed frustration over another appointment without an interpreter. I assured him that we have worked hard to have one at their appointments, but there is not one available today. We will work  on this further for future appointments to provide the best care and communication with the patient.   Plan: Provided Biktarvy samples x2 weeks Follow up with Eric Keith on 12/16 @ 10:30 and set up interpreter for Sango language   Eric Keith, PharmD PGY2 Infectious Diseases Pharmacy Resident

## 2020-12-15 ENCOUNTER — Other Ambulatory Visit: Payer: Self-pay | Admitting: Pharmacist

## 2020-12-15 DIAGNOSIS — B20 Human immunodeficiency virus [HIV] disease: Secondary | ICD-10-CM

## 2020-12-15 MED ORDER — BICTEGRAVIR-EMTRICITAB-TENOFOV 50-200-25 MG PO TABS: 1.0000 | ORAL_TABLET | Freq: Every day | ORAL | 0 refills | Status: AC

## 2020-12-15 NOTE — Progress Notes (Signed)
Medication Samples have been provided to the patient.  Drug name: Biktarvy        Strength: 50/200/25 mg       Qty: 14 tablets (2 bottles) LOT: [id number]   Exp.Date: 10/24  Dosing instructions: Take one tablet by mouth once daily  The patient has been instructed regarding the correct time, dose, and frequency of taking this medication, including desired effects and most common side effects.   Larin Depaoli, PharmD, CPP Clinical Pharmacist Practitioner Infectious Diseases Clinical Pharmacist [facility]  

## 2021-01-21 ENCOUNTER — Ambulatory Visit: Payer: BC Managed Care – PPO | Admitting: Infectious Diseases

## 2021-03-29 ENCOUNTER — Other Ambulatory Visit: Payer: Self-pay | Admitting: Infectious Diseases

## 2021-03-29 ENCOUNTER — Telehealth: Payer: Self-pay

## 2021-03-29 DIAGNOSIS — Z21 Asymptomatic human immunodeficiency virus [HIV] infection status: Secondary | ICD-10-CM

## 2021-03-29 NOTE — Telephone Encounter (Signed)
Called interpreter line to get patient an appointment scheduled, no luck with getting an interpreter on the line to reach the patient.. Will try to call back to get an appointment scheduled.

## 2021-04-27 ENCOUNTER — Telehealth: Payer: Self-pay

## 2021-04-27 NOTE — Telephone Encounter (Signed)
-----   Message from Juanita Laster, [place] sent at 03/29/2021 11:58 AM EST ----- ?Regarding: appt ?PT needs appt with Durwin Nora, Np.  ?Will need to arrange interpreter for him. ?Juanita Laster, RMA ? ? ?

## 2021-04-27 NOTE — Telephone Encounter (Signed)
Have tired calling interpreter line for Sango and no one is available was told to call back with in an hour or 2, need to reschedule a follow up appointment for patient. ?

## 2021-06-30 ENCOUNTER — Encounter: Payer: Self-pay | Admitting: Infectious Diseases

## 2021-07-06 ENCOUNTER — Encounter: Payer: Self-pay | Admitting: Infectious Diseases

## 2021-07-06 ENCOUNTER — Ambulatory Visit (INDEPENDENT_AMBULATORY_CARE_PROVIDER_SITE_OTHER): Payer: BC Managed Care – PPO | Admitting: Infectious Diseases

## 2021-07-06 ENCOUNTER — Other Ambulatory Visit (HOSPITAL_COMMUNITY): Payer: Self-pay

## 2021-07-06 ENCOUNTER — Other Ambulatory Visit: Payer: Self-pay

## 2021-07-06 VITALS — BP 167/104 | HR 80 | Temp 98.4°F | Wt 214.0 lb

## 2021-07-06 DIAGNOSIS — I10 Essential (primary) hypertension: Secondary | ICD-10-CM | POA: Diagnosis not present

## 2021-07-06 DIAGNOSIS — Z21 Asymptomatic human immunodeficiency virus [HIV] infection status: Secondary | ICD-10-CM

## 2021-07-06 DIAGNOSIS — Z0000 Encounter for general adult medical examination without abnormal findings: Secondary | ICD-10-CM | POA: Diagnosis not present

## 2021-07-06 MED ORDER — ROSUVASTATIN CALCIUM 5 MG PO TABS
5.0000 mg | ORAL_TABLET | Freq: Every day | ORAL | 11 refills | Status: DC
  Filled 2021-07-06 – 2021-07-30 (×3): qty 30, 30d supply, fill #0
  Filled 2021-08-27: qty 30, 30d supply, fill #1

## 2021-07-06 MED ORDER — BIKTARVY 50-200-25 MG PO TABS: ORAL_TABLET | ORAL | 11 refills | Status: DC

## 2021-07-06 MED ORDER — AMLODIPINE BESYLATE 5 MG PO TABS
5.0000 mg | ORAL_TABLET | Freq: Every day | ORAL | 2 refills | Status: DC
  Filled 2021-07-06 – 2021-07-30 (×4): qty 30, 30d supply, fill #0
  Filled 2021-08-27: qty 30, 30d supply, fill #1

## 2021-07-06 NOTE — Progress Notes (Signed)
Name: Eric Keith  DOB: [birth year] MRN: [id number] PCP: Patient, No Pcp Per (Inactive)    Brief Narrative:  Eric Keith is a 46 y.o. male with HIV disease, Dx at a rescue camp in [place] 2005. He did not start on medications right away but when he arrived here in [place] in 2016 he his since been on antiretroviral therapy.  HIV Risk: sexual, endemic geography  History of OIs: Unknown Intake Labs 05/2018 Hep B sAg (-), sAb (-), cAb (-); Hep A (+), Hep C (-) Quantiferon (-) HLA B*5701 (+) G6PD: ()  Previous Regimens: Genvoya >> suppressed Biktarvy   Genotypes: 2017: wildtype  Subjective:  CC: HIV follow up care. Has not had medication in about 1 month.     HPI: Sango interpretor used to facilitate visit via Telephone services.   He is doing well. Has not had deliver of [place] since April for some reason.  Requesting to be checked for diabetes today. No family history to his knowledge. No symptoms that he is experiencing   He has not been hospitalized or ill since our last OV in July 2022. Feeling well, sleep is acceptable (works overnight until 0400 am) and has access to food.   He is requesting help with citizenship application & English classes.     Review of Systems  Constitutional:  Negative for chills, fever, malaise/fatigue and weight loss.  HENT:  Negative for sore throat.        No dental problems  Respiratory:  Negative for cough and sputum production.   Cardiovascular:  Negative for chest pain and leg swelling.  Gastrointestinal:  Negative for abdominal pain, diarrhea and vomiting.  Genitourinary:  Negative for dysuria and flank pain.  Musculoskeletal:  Negative for joint pain, myalgias and neck pain.  Skin:  Negative for rash.  Neurological:  Negative for dizziness, tingling and headaches.  Psychiatric/Behavioral:  Negative for depression and substance abuse. The patient is not nervous/anxious and does not have insomnia.     Past  Medical History:  Diagnosis Date   Healthcare maintenance    History of syphilis    HIV infection ([place])    Inguinal hernia of left side without obstruction or gangrene     Outpatient Medications Prior to Visit  Medication Sig Dispense Refill   BIKTARVY 50-200-25 MG TABS tablet TAKE 1 TABLET BY MOUTH 1 TIME A DAY. 30 tablet 4   amLODipine (NORVASC) 5 MG tablet Take 1 tablet (5 mg total) by mouth daily. (Patient not taking: Reported on 07/06/2021) 30 tablet 11   No facility-administered medications prior to visit.     Allergies  Allergen Reactions   Abacavir     HLA B*5701 (+)    Social History   Tobacco Use   Smoking status: Never   Smokeless tobacco: Never  Substance Use Topics   Alcohol use: Never   Drug use: Never    Comment: Condoms given    Social History   Substance and Sexual Activity  Sexual Activity Yes   Partners: Female     Objective:   Vitals:   07/06/21 0858  BP: (!) 167/104  Pulse: 80  Temp: 98.4 F (36.9 C)  TempSrc: Oral  SpO2: 98%  Weight: 214 lb (97.1 kg)   There is no height or weight on file to calculate BMI.  Physical Exam Constitutional:      Appearance: Normal appearance. He is not ill-appearing.  HENT:     Head: Normocephalic.     Mouth/Throat:  Mouth: Mucous membranes are moist.     Pharynx: Oropharynx is clear.  Eyes:     General: No scleral icterus. Pulmonary:     Effort: Pulmonary effort is normal.  Musculoskeletal:        General: Normal range of motion.     Cervical back: Normal range of motion.  Skin:    Coloration: Skin is not jaundiced or pale.  Neurological:     Mental Status: He is alert and oriented to person, place, and time.  Psychiatric:        Mood and Affect: Mood normal.        Judgment: Judgment normal.     Lab Results Lab Results  Component Value Date   WBC 5.1 11/29/2020   HGB 14.8 11/29/2020   HCT 43.5 11/29/2020   MCV 90.4 11/29/2020   PLT 186 11/29/2020    Lab Results  Component  Value Date   CREATININE 0.81 11/29/2020   BUN 18 11/29/2020   NA 140 11/29/2020   K 4.3 11/29/2020   CL 106 11/29/2020   CO2 27 11/29/2020    Lab Results  Component Value Date   ALT 21 11/29/2020   AST 23 11/29/2020   BILITOT 0.4 11/29/2020    Lab Results  Component Value Date   CHOL 156 11/29/2020   HDL 46 11/29/2020   LDLCALC 96 11/29/2020   TRIG 56 11/29/2020   CHOLHDL 3.4 11/29/2020   HIV 1 RNA Quant  Date Value  11/29/2020 Not Detected Copies/mL  08/19/2020 27 Copies/mL (H)  07/10/2019 80 copies/mL (H)   CD4 T Cell Abs (/uL)  Date Value  11/29/2020 364 (L)  08/19/2020 517  07/10/2019 466     Assessment & Plan:   Problem List Items Addressed This Visit       Unprioritized   HIV infection ([place]) - Primary (Chronic)    Has been getting fills up until the beginning of April. Renewed prescription for 1 year.  Check VL today and CD4, CMP, CBC. Vaccines to be updated next visit.   With new research coming out by way of the REPRIEVE study regarding CVD risk reduction for PLWH, discussed that over the 8 year trial initiation of statin therapy was shown to reduce CV disease by 35% independent of other risk factors.  We discussed the patient's 10-year CVD Risk Score to be 10.6%, at least moderately elevated, and would benefit from intervention given HIV+ and > 22 yo.  Start Crestor 5 mg daily.       Relevant Medications   bictegravir-emtricitabine-tenofovir AF (BIKTARVY) 50-200-25 MG TABS tablet   amLODipine (NORVASC) 5 MG tablet   Other Relevant Orders   HIV-1 RNA quant-no reflex-bld   COMPLETE METABOLIC PANEL WITH GFR   CBC with Differential/Platelet   T-helper cells (CD4) count   Hypertension    Will restart amlodipine 5 mg daily. Will see if [facility] can mail to his home monthly.  CMP today. A1C today.   RTC appt scheduled and communicated today with interpretor in August to re-assess.        Relevant Medications   amLODipine (NORVASC) 5 MG tablet    rosuvastatin (CRESTOR) 5 MG tablet   Other Relevant Orders   COMPLETE METABOLIC PANEL WITH GFR   Lipid panel   Preventative health care    Check A1C today, PSA.  Will need updated immunizations with Prevnar 20 to complete pneumococcal series - we did not get a chance to discuss this today and will arrange  for August visit. Due for flu in the fall and Menveo booster in 2024.  Colonoscopy for colon cancer screening referral to be discussed at follow up.        Relevant Orders   Lipid panel   Hemoglobin A1c   PSA   Other Visit Diagnoses     Asymptomatic HIV infection ([place])       Relevant Medications   bictegravir-emtricitabine-tenofovir AF (BIKTARVY) 50-200-25 MG TABS tablet   amLODipine (NORVASC) 5 MG tablet      Total Encounter Time: 35 minutes   Janene Madeira, MSN, NP-C [facility] for Infectious [place] Pager: [phone] Office: [phone]  07/06/21  9:58 AM

## 2021-07-06 NOTE — Patient Instructions (Addendum)
Nice to see you!  [facility] [address] [place]  [address]   Tuesdays & Wednesday 0930 - 1300 for help with resources.

## 2021-07-06 NOTE — Assessment & Plan Note (Signed)
Has been getting fills up until the beginning of April. Renewed prescription for 1 year.  Check VL today and CD4, CMP, CBC. Vaccines to be updated next visit.   With new research coming out by way of the REPRIEVE study regarding CVD risk reduction for PLWH, discussed that over the 8 year trial initiation of statin therapy was shown to reduce CV disease by 35% independent of other risk factors.  We discussed the patient's 10-year CVD Risk Score to be 10.6%, at least moderately elevated, and would benefit from intervention given HIV+ and > 18 yo.  Start Crestor 5 mg daily.

## 2021-07-06 NOTE — Assessment & Plan Note (Addendum)
Check A1C today, PSA.  Will need updated immunizations with Prevnar 20 to complete pneumococcal series - we did not get a chance to discuss this today and will arrange for August visit. Due for flu in the fall and Menveo booster in 2024.  Colonoscopy for colon cancer screening referral to be discussed at follow up.

## 2021-07-06 NOTE — Assessment & Plan Note (Signed)
Will restart amlodipine 5 mg daily. Will see if [facility] can mail to his home monthly.  CMP today. A1C today.   RTC appt scheduled and communicated today with interpretor in August to re-assess.

## 2021-07-07 LAB — T-HELPER CELLS (CD4) COUNT (NOT AT ARMC)
CD4 % Helper T Cell: 34 % (ref 33–65)
CD4 T Cell Abs: 411 /uL (ref 400–1790)

## 2021-07-07 NOTE — Progress Notes (Signed)
Noted to have pre-diabetes/insulin resistance with A1C 5.7%. Will work on lifestyle medicine recommendations at upcoming visit and see if we can help through nutrition assessment and recs.  Started on statin for co-morbidities of HTN, age > 28 with HIV Will discuss in depth at next OV given language barriers and low health literacy, in person communication strongly preferred

## 2021-07-10 LAB — LIPID PANEL
Cholesterol: 167 mg/dL (ref <200)
HDL: 34 mg/dL — ABNORMAL LOW (ref >=40)
LDL Cholesterol (Calc): 96 mg/dL (calc)
Non-HDL Cholesterol (Calc): 133 mg/dL (calc) — ABNORMAL HIGH (ref <130)
Total CHOL/HDL Ratio: 4.9 (calc) (ref <5.0)
Triglycerides: 244 mg/dL — ABNORMAL HIGH (ref <150)

## 2021-07-10 LAB — HEMOGLOBIN A1C
Hgb A1c MFr Bld: 5.7 % of total Hgb — ABNORMAL HIGH (ref <5.7)
Mean Plasma Glucose: 117 mg/dL
eAG (mmol/L): 6.5 mmol/L

## 2021-07-10 LAB — COMPLETE METABOLIC PANEL WITH GFR
AG Ratio: 1.4 (calc) (ref 1.0–2.5)
ALT: 25 U/L (ref 9–46)
AST: 24 U/L (ref 10–40)
Albumin: 4.2 g/dL (ref 3.6–5.1)
Alkaline phosphatase (APISO): 60 U/L (ref 36–130)
BUN: 12 mg/dL (ref 7–25)
CO2: 26 mmol/L (ref 20–32)
Calcium: 9.2 mg/dL (ref 8.6–10.3)
Chloride: 106 mmol/L (ref 98–110)
Creat: 0.99 mg/dL (ref 0.60–1.29)
Globulin: 2.9 g/dL (calc) (ref 1.9–3.7)
Glucose, Bld: 100 mg/dL — ABNORMAL HIGH (ref 65–99)
Potassium: 4 mmol/L (ref 3.5–5.3)
Sodium: 140 mmol/L (ref 135–146)
Total Bilirubin: 0.3 mg/dL (ref 0.2–1.2)
Total Protein: 7.1 g/dL (ref 6.1–8.1)
eGFR: 96 mL/min/{1.73_m2} (ref >=60)

## 2021-07-10 LAB — CBC WITH DIFFERENTIAL/PLATELET
Absolute Monocytes: 353 cells/uL (ref 200–950)
Basophils Absolute: 29 cells/uL (ref 0–200)
Basophils Relative: 0.7 %
Eosinophils Absolute: 382 cells/uL (ref 15–500)
Eosinophils Relative: 9.1 %
HCT: 42 % (ref 38.5–50.0)
Hemoglobin: 14.1 g/dL (ref 13.2–17.1)
Lymphs Abs: 1361 cells/uL (ref 850–3900)
MCH: 30.3 pg (ref 27.0–33.0)
MCHC: 33.6 g/dL (ref 32.0–36.0)
MCV: 90.1 fL (ref 80.0–100.0)
MPV: 10.2 fL (ref 7.5–12.5)
Monocytes Relative: 8.4 %
Neutro Abs: 2075 cells/uL (ref 1500–7800)
Neutrophils Relative %: 49.4 %
Platelets: 195 10*3/uL (ref 140–400)
RBC: 4.66 10*6/uL (ref 4.20–5.80)
RDW: 13.2 % (ref 11.0–15.0)
Total Lymphocyte: 32.4 %
WBC: 4.2 10*3/uL (ref 3.8–10.8)

## 2021-07-10 LAB — HIV-1 RNA QUANT-NO REFLEX-BLD
HIV 1 RNA Quant: <20 Copies/mL — ABNORMAL HIGH
HIV-1 RNA Quant, Log: <1.3 Log cps/mL — ABNORMAL HIGH

## 2021-07-10 LAB — PSA: PSA: 3.88 ng/mL (ref <=4.00)

## 2021-07-11 ENCOUNTER — Other Ambulatory Visit (HOSPITAL_COMMUNITY): Payer: Self-pay

## 2021-07-12 ENCOUNTER — Other Ambulatory Visit (HOSPITAL_COMMUNITY): Payer: Self-pay

## 2021-07-20 ENCOUNTER — Other Ambulatory Visit (HOSPITAL_COMMUNITY): Payer: Self-pay

## 2021-07-21 ENCOUNTER — Other Ambulatory Visit (HOSPITAL_COMMUNITY): Payer: Self-pay

## 2021-07-29 ENCOUNTER — Other Ambulatory Visit (HOSPITAL_COMMUNITY): Payer: Self-pay

## 2021-07-30 ENCOUNTER — Other Ambulatory Visit (HOSPITAL_COMMUNITY): Payer: Self-pay

## 2021-08-02 ENCOUNTER — Other Ambulatory Visit (HOSPITAL_COMMUNITY): Payer: Self-pay

## 2021-08-03 ENCOUNTER — Other Ambulatory Visit (HOSPITAL_COMMUNITY): Payer: Self-pay

## 2021-08-04 ENCOUNTER — Other Ambulatory Visit (HOSPITAL_COMMUNITY): Payer: Self-pay

## 2021-08-27 ENCOUNTER — Other Ambulatory Visit (HOSPITAL_COMMUNITY): Payer: Self-pay

## 2021-09-07 ENCOUNTER — Other Ambulatory Visit (HOSPITAL_COMMUNITY): Payer: Self-pay

## 2021-10-03 ENCOUNTER — Telehealth: Payer: Self-pay

## 2021-10-03 ENCOUNTER — Ambulatory Visit: Payer: BC Managed Care – PPO | Admitting: Family

## 2021-10-03 NOTE — Telephone Encounter (Signed)
Called patient regarding missed appointment for today with assistance from phone interpreter. Interpreter left voicemail requesting patient call back to reschedule.  Will need to schedule sango interpreter for next visit.  Juanita Laster, RMA

## 2022-02-16 ENCOUNTER — Encounter: Payer: Self-pay | Admitting: Infectious Diseases

## 2022-02-16 ENCOUNTER — Other Ambulatory Visit (HOSPITAL_COMMUNITY): Payer: Self-pay

## 2022-02-16 ENCOUNTER — Other Ambulatory Visit: Payer: Self-pay

## 2022-02-16 ENCOUNTER — Ambulatory Visit (INDEPENDENT_AMBULATORY_CARE_PROVIDER_SITE_OTHER): Payer: Self-pay | Admitting: Infectious Diseases

## 2022-02-16 ENCOUNTER — Other Ambulatory Visit: Payer: Self-pay | Admitting: Pharmacist

## 2022-02-16 VITALS — BP 174/105 | HR 66 | Temp 97.3°F | Wt 198.0 lb

## 2022-02-16 DIAGNOSIS — Z21 Asymptomatic human immunodeficiency virus [HIV] infection status: Secondary | ICD-10-CM

## 2022-02-16 DIAGNOSIS — I10 Essential (primary) hypertension: Secondary | ICD-10-CM

## 2022-02-16 DIAGNOSIS — B20 Human immunodeficiency virus [HIV] disease: Secondary | ICD-10-CM

## 2022-02-16 DIAGNOSIS — Z8619 Personal history of other infectious and parasitic diseases: Secondary | ICD-10-CM

## 2022-02-16 MED ORDER — AMLODIPINE BESYLATE 5 MG PO TABS: 5.0000 mg | ORAL_TABLET | Freq: Every day | ORAL | 2 refills | Status: AC

## 2022-02-16 MED ORDER — ROSUVASTATIN CALCIUM 5 MG PO TABS: 5.0000 mg | ORAL_TABLET | Freq: Every day | ORAL | 11 refills | Status: AC

## 2022-02-16 MED ORDER — BICTEGRAVIR-EMTRICITAB-TENOFOV 50-200-25 MG PO TABS: 1.0000 | ORAL_TABLET | Freq: Every day | ORAL | 0 refills | Status: AC

## 2022-02-16 MED ORDER — BIKTARVY 50-200-25 MG PO TABS: ORAL_TABLET | ORAL | 11 refills | Status: AC

## 2022-02-16 NOTE — Progress Notes (Signed)
Name: Eric Keith  DOB: 09/09/1975 MRN: [id number] PCP: Patient, No Pcp Per    Brief Narrative:  Eric Keith is a 47 y.o. male with HIV disease, Dx at a rescue camp in [place] 2005. He did not start on medications right away but when he arrived here in [place] in 2016 he his since been on antiretroviral therapy.  HIV Risk: sexual, endemic geography  History of OIs: Unknown Intake Labs 05/2018 Hep B sAg (-), sAb (-), cAb (-); Hep A (+), Hep C (-) Quantiferon (-) HLA B*5701 (+) G6PD: ()  Previous Regimens: Genvoya >> suppressed Biktarvy   Genotypes: 2017: wildtype  Subjective:  CC: HIV follow up care. Has not had medication in nearly 1 year.     HPI: Sango interpretor used to facilitate visit via Telephone services.   He has been out of medication for about 1 year now. States that the pharmacy told him that he does not have an active rx on file. But he is not exactly sure as his young daughter has tried to help him call and interpret for him. One day he received a bill of $120 but after that he has not received anything further. He is not sure that anything has changed with his health insurance.   His blood pressure is very elevated today. He is not having any symptoms associated to this but worried about it.  He does not have a PCP. He has not yet had the flu vaccine. COVID booster last year but declines another dose at this time.     Review of Systems  Constitutional:  Negative for chills, fever, malaise/fatigue and weight loss.  HENT:  Negative for sore throat.        No dental problems  Respiratory:  Negative for cough and sputum production.   Cardiovascular:  Negative for chest pain and leg swelling.  Gastrointestinal:  Negative for abdominal pain, diarrhea and vomiting.  Genitourinary:  Negative for dysuria and flank pain.  Musculoskeletal:  Negative for joint pain, myalgias and neck pain.  Skin:  Negative for rash.  Neurological:  Negative for  dizziness, tingling and headaches.  Psychiatric/Behavioral:  Negative for depression and substance abuse. The patient is not nervous/anxious and does not have insomnia.      Past Medical History:  Diagnosis Date   Healthcare maintenance    History of syphilis    HIV infection ([place])    Inguinal hernia of left side without obstruction or gangrene     Outpatient Medications Prior to Visit  Medication Sig Dispense Refill   amLODipine (NORVASC) 5 MG tablet Take 1 tablet (5 mg total) by mouth daily. (Patient not taking: Reported on 02/16/2022) 30 tablet 2   bictegravir-emtricitabine-tenofovir AF (BIKTARVY) 50-200-25 MG TABS tablet TAKE 1 TABLET BY MOUTH 1 TIME A DAY. (Patient not taking: Reported on 02/16/2022) 30 tablet 11   rosuvastatin (CRESTOR) 5 MG tablet Take 1 tablet (5 mg total) by mouth daily. (Patient not taking: Reported on 02/16/2022) 30 tablet 11   No facility-administered medications prior to visit.     Allergies  Allergen Reactions   Abacavir     HLA B*5701 (+)    Social History   Tobacco Use   Smoking status: Never   Smokeless tobacco: Never  Substance Use Topics   Alcohol use: Never   Drug use: Never    Comment: Condoms given    Social History   Substance and Sexual Activity  Sexual Activity Yes   Partners: Female  Objective:   Vitals:   02/16/22 1031 02/16/22 1100  BP: (!) 175/125 (!) 174/105  Pulse: 66   Temp: (!) 97.3 F (36.3 C)   TempSrc: Oral   SpO2: 98%   Weight: 198 lb (89.8 kg)    There is no height or weight on file to calculate BMI.  Physical Exam Constitutional:      Appearance: Normal appearance. He is not ill-appearing.  HENT:     Head: Normocephalic.     Mouth/Throat:     Mouth: Mucous membranes are moist.     Pharynx: Oropharynx is clear.  Eyes:     General: No scleral icterus. Pulmonary:     Effort: Pulmonary effort is normal.  Musculoskeletal:        General: Normal range of motion.     Cervical back: Normal range  of motion.  Skin:    Coloration: Skin is not jaundiced or pale.  Neurological:     Mental Status: He is alert and oriented to person, place, and time.  Psychiatric:        Mood and Affect: Mood normal.        Judgment: Judgment normal.      Lab Results Lab Results  Component Value Date   WBC 4.2 07/06/2021   HGB 14.1 07/06/2021   HCT 42.0 07/06/2021   MCV 90.1 07/06/2021   PLT 195 07/06/2021    Lab Results  Component Value Date   CREATININE 0.99 07/06/2021   BUN 12 07/06/2021   NA 140 07/06/2021   K 4.0 07/06/2021   CL 106 07/06/2021   CO2 26 07/06/2021    Lab Results  Component Value Date   ALT 25 07/06/2021   AST 24 07/06/2021   BILITOT 0.3 07/06/2021    Lab Results  Component Value Date   CHOL 167 07/06/2021   HDL 34 (L) 07/06/2021   LDLCALC 96 07/06/2021   TRIG 244 (H) 07/06/2021   CHOLHDL 4.9 07/06/2021   HIV 1 RNA Quant (Copies/mL)  Date Value  07/06/2021 <20 (H)  11/29/2020 Not Detected  08/19/2020 27 (H)   CD4 T Cell Abs (/uL)  Date Value  07/06/2021 411  11/29/2020 364 (L)  08/19/2020 517     Assessment & Plan:   Problem List Items Addressed This Visit       Unprioritized   History of syphilis - Primary    Stable serofast titer indicative of old infection. Annual follow up to be checked at next lab draw in 18m.       HIV infection (HCC) (Chronic)    Evidently [facility] is no longer active. He met with financial team today and has ADAP assistance pending. We provided Mr. Stoiber with 4 weeks of samples today for Biktarvy. I sent all of his rx's to the [facility] specialty pharmacy with instructions to call his daughter, Narda Rutherford to help with coordination of fills and shipments. He assures me he will call me in February if he has still not received his medications. Last CD4 was > 400. No OI concerns on exam today. Will defer recheck until his appt return in late March / early April.   Flu vaccine provided today. Not a candidate for  rectal cancer screen as he is not MSM.   I also spoke with his wife on the phone by proxy our telephone interpretor today and helped her with the [place] Medicaid number to contact.       Relevant Medications   bictegravir-emtricitabine-tenofovir AF (BIKTARVY)  50-200-25 MG TABS tablet   amLODipine (NORVASC) 5 MG tablet   Hypertension    Uncontrolled off medication. I asked him to make sure he continues his amlodipine once it is sent everyday until his next appt with me so we can properly adjust the dose for him to get BP < 130/80 (pre-diabetic).   Statin medication filled as well and will plan to increase at return.       Relevant Medications   rosuvastatin (CRESTOR) 5 MG tablet   amLODipine (NORVASC) 5 MG tablet   Other Visit Diagnoses     Asymptomatic HIV infection (HCC)       Relevant Medications   bictegravir-emtricitabine-tenofovir AF (BIKTARVY) 50-200-25 MG TABS tablet   amLODipine (NORVASC) 5 MG tablet      Total encounter time including personal time spent with interpretation, coordination of care 25 minutes.   Janene Madeira, MSN, NP-C [facility] for Infectious [place] Pager: [phone] Office: [phone]  02/16/22  1:33 PM

## 2022-02-16 NOTE — Progress Notes (Signed)
Medication Samples have been provided to the patient.  Drug name: Biktarvy        Strength: 50/200/25 mg       Qty: 28 tablets (4 bottles) LOT: CPBDCA   Exp.Date: 3/26  Dosing instructions: Take one tablet by mouth once daily  The patient has been instructed regarding the correct time, dose, and frequency of taking this medication, including desired effects and most common side effects.   Tiaja Hagan, PharmD, CPP, BCIDP, AAHIVP Clinical Pharmacist Practitioner Infectious Diseases Clinical Pharmacist [facility]  

## 2022-02-16 NOTE — Assessment & Plan Note (Signed)
Evidently [employer] is no longer active. He met with financial team today and has ADAP assistance pending. We provided Mr. Quam with 4 weeks of samples today for Biktarvy. I sent all of his rx's to the [facility] with instructions to call his daughter, [name] to help with coordination of fills and shipments. He assures me he will call me in February if he has still not received his medications. Last CD4 was > 400. No OI concerns on exam today. Will defer recheck until his appt return in late March / early April.   Flu vaccine provided today. Not a candidate for rectal cancer screen as he is not MSM.   I also spoke with his wife on the phone by proxy our telephone interpretor today and helped her with the [place] Medicaid number to contact.

## 2022-02-16 NOTE — Assessment & Plan Note (Signed)
Stable serofast titer indicative of old infection. Annual follow up to be checked at next lab draw in 21m.

## 2022-02-16 NOTE — Patient Instructions (Addendum)
Please come back to see me again in 3 months.   If you do not get your medication refills in the mail by February please call my office so we can look into it for you. We won't know you are not getting it until you let us know.    Please take all 3 medications that you will receive in the mail.    Contact the Medicaid office  [place] [employer] [place] Phone: [phone] Monday-Friday 8 a.m. to 5 p.m.

## 2022-02-16 NOTE — Assessment & Plan Note (Signed)
Uncontrolled off medication. I asked him to make sure he continues his amlodipine once it is sent everyday until his next appt with me so we can properly adjust the dose for him to get BP < 130/80 (pre-diabetic).   Statin medication filled as well and will plan to increase at return.

## 2022-04-18 ENCOUNTER — Other Ambulatory Visit (HOSPITAL_COMMUNITY): Payer: Self-pay

## 2022-05-15 ENCOUNTER — Ambulatory Visit: Payer: Medicaid Other | Admitting: Infectious Diseases

## 2022-07-20 ENCOUNTER — Other Ambulatory Visit (HOSPITAL_COMMUNITY): Payer: Self-pay

## 2023-06-18 NOTE — Progress Notes (Signed)
 The ASCVD Risk score (Arnett DK, et al., 2019) failed to calculate for the following reasons:   The systolic blood pressure is missing  Eric Keith, BSN, Charity fundraiser

## 2023-07-31 ENCOUNTER — Encounter: Payer: Self-pay | Admitting: Infectious Diseases

## 2023-11-23 ENCOUNTER — Ambulatory Visit: Admitting: Infectious Diseases

## 2024-01-22 ENCOUNTER — Telehealth: Payer: Self-pay

## 2024-01-22 NOTE — Telephone Encounter (Signed)
 Left patient a voice mail to call back to schedule an appointment with Corean Fireman.
